# Patient Record
Sex: Male | Born: 1975 | Race: Black or African American | Hispanic: No | Smoking: Current every day smoker
Health system: Southern US, Community
[De-identification: ages and names within clinical notes are randomized; demographics above are authoritative.]

## PROBLEM LIST (undated history)

## (undated) DIAGNOSIS — M549 Dorsalgia, unspecified: Secondary | ICD-10-CM

## (undated) DIAGNOSIS — F419 Anxiety disorder, unspecified: Secondary | ICD-10-CM

---

## 1999-05-14 ENCOUNTER — Emergency Department (HOSPITAL_COMMUNITY): Admission: EM | Admit: 1999-05-14 | Discharge: 1999-05-14 | Payer: Self-pay | Admitting: Emergency Medicine

## 1999-06-17 ENCOUNTER — Emergency Department (HOSPITAL_COMMUNITY): Admission: EM | Admit: 1999-06-17 | Discharge: 1999-06-17 | Payer: Self-pay | Admitting: Emergency Medicine

## 1999-06-17 ENCOUNTER — Encounter: Payer: Self-pay | Admitting: Emergency Medicine

## 2018-01-13 ENCOUNTER — Other Ambulatory Visit: Payer: Self-pay

## 2018-01-13 ENCOUNTER — Emergency Department (HOSPITAL_COMMUNITY)
Admission: EM | Admit: 2018-01-13 | Discharge: 2018-01-13 | Disposition: A | Discharge status: Home / Self Care | Payer: Self-pay | Attending: Emergency Medicine | Admitting: Emergency Medicine

## 2018-01-13 ENCOUNTER — Encounter (HOSPITAL_COMMUNITY): Payer: Self-pay

## 2018-01-13 ENCOUNTER — Emergency Department (HOSPITAL_COMMUNITY): Payer: Self-pay

## 2018-01-13 DIAGNOSIS — R0789 Other chest pain: Secondary | ICD-10-CM | POA: Insufficient documentation

## 2018-01-13 DIAGNOSIS — K649 Unspecified hemorrhoids: Secondary | ICD-10-CM | POA: Insufficient documentation

## 2018-01-13 DIAGNOSIS — R197 Diarrhea, unspecified: Secondary | ICD-10-CM | POA: Insufficient documentation

## 2018-01-13 HISTORY — DX: Dorsalgia, unspecified: M54.9

## 2018-01-13 LAB — CBC
HCT: 39.8 % (ref 39.0–52.0)
Hemoglobin: 13.9 g/dL (ref 13.0–17.0)
MCH: 27.6 pg (ref 26.0–34.0)
MCHC: 34.9 g/dL (ref 30.0–36.0)
MCV: 79 fL (ref 78.0–100.0)
PLATELETS: 203 10*3/uL (ref 150–400)
RBC: 5.04 MIL/uL (ref 4.22–5.81)
RDW: 12.3 % (ref 11.5–15.5)
WBC: 4.4 10*3/uL (ref 4.0–10.5)

## 2018-01-13 LAB — BASIC METABOLIC PANEL
Anion gap: 10 (ref 5–15)
BUN: 7 mg/dL (ref 6–20)
CHLORIDE: 102 mmol/L (ref 98–111)
CO2: 28 mmol/L (ref 22–32)
CREATININE: 1.18 mg/dL (ref 0.61–1.24)
Calcium: 9.5 mg/dL (ref 8.9–10.3)
GFR calc Af Amer: >60 mL/min (ref >60)
GFR calc non Af Amer: >60 mL/min (ref >60)
GLUCOSE: 89 mg/dL (ref 70–99)
POTASSIUM: 3.7 mmol/L (ref 3.5–5.1)
SODIUM: 140 mmol/L (ref 135–145)

## 2018-01-13 LAB — I-STAT TROPONIN, ED: Troponin i, poc: 0.02 ng/mL (ref 0.00–0.08)

## 2018-01-13 MED ORDER — CYCLOBENZAPRINE HCL 10 MG PO TABS: 10.0000 mg | ORAL_TABLET | Freq: Two times a day (BID) | ORAL | 0 refills | Status: DC | PRN

## 2018-01-13 MED ORDER — HYDROCORTISONE 2.5 % RE CREA: TOPICAL_CREAM | RECTAL | 1 refills | Status: DC

## 2018-01-13 NOTE — ED Provider Notes (Addendum)
[facility] EMERGENCY DEPARTMENT Provider Note   CSN: [id number] Arrival date & time: 01/13/18  1910     History   Chief Complaint Chief Complaint  Patient presents with  . Chest Pain  . Diarrhea    HPI Jamie Robinson is a 42 y.o. male.  Patient is a 42 year old male with a history of chronic back pain on tramadol for the last month presenting today with diarrhea, occasional nausea, decreased appetite and some intermittent chest pain.  Patient states that he stopped taking tramadol for 5 days ago and that is when his stool became loose and having diarrhea.  He states because the tramadol constipated and he decided to stop taking it.  He states over the last few days he has noticed some chest pain which he states encompasses the whole left side of his chest and it will have a sharp prickling sensation.  It is not related to exertion and usually starts at rest.  It is not related to eating.  He states he has had issues like this since he was a kid.  No heart history.  He does use marijuana at night to get sleep but denies cocaine use and does not use cigarettes.  He has no significant family history for heart disease.  He states he normally walks several miles a day and has not had any unilateral leg pain or swelling.  He did recently move here at this week from [place] which was about an 8-hour car ride.  He denies any shortness of breath or URI symptoms.  The history is provided by the patient.  Chest Pain   This is a recurrent problem. The current episode started yesterday. The problem occurs daily. The problem has been resolved. Associated with: starts at rest and worse with different movements. The pain is present in the lateral region. The pain is at a severity of 3/10. The pain is mild. The quality of the pain is described as brief and sharp (tingling and stabbing). The pain does not radiate. Exacerbated by: not exertional.  he walks several miles a day and never has  pain with exertion. Associated symptoms include back pain and nausea. Pertinent negatives include no cough, no dizziness, no fever, no numbness, no shortness of breath, no sputum production, no vomiting and no weakness. Associated symptoms comments: Chronic back pain but nothing new.. He has tried nothing for the symptoms. There are no known risk factors.  Diarrhea   Pertinent negatives include no vomiting and no cough.    Past Medical History:  Diagnosis Date  . Back pain     There are no active problems to display for this patient.   History reviewed. No pertinent surgical history.      Home Medications    Prior to Admission medications   Not on File    Family History History reviewed. No pertinent family history.  Social History Social History   Tobacco Use  . Smoking status: Never Smoker  . Smokeless tobacco: Never Used  Substance Use Topics  . Alcohol use: Yes    Comment: occ  . Drug use: Yes    Types: Marijuana     Allergies   Patient has no known allergies.   Review of Systems Review of Systems  Constitutional: Negative for fever.  Respiratory: Negative for cough, sputum production and shortness of breath.   Cardiovascular: Positive for chest pain.  Gastrointestinal: Positive for diarrhea and nausea. Negative for vomiting.  Genitourinary:  History of hemorrhoids and since having the loose stool he has bright red blood when he wipes intermittently.  He has intermittent rectal pain.  Musculoskeletal: Positive for back pain.  Neurological: Negative for dizziness, weakness and numbness.  All other systems reviewed and are negative.    Physical Exam Updated Vital Signs BP 104/66   Pulse 61   Temp 98.1 F (36.7 C) (Oral)   Resp 16   SpO2 100%   Physical Exam  Constitutional: He is oriented to person, place, and time. He appears well-developed and well-nourished. No distress.  HENT:  Head: Normocephalic and atraumatic.  Mouth/Throat:  Oropharynx is clear and moist.  Eyes: Pupils are equal, round, and reactive to light. Conjunctivae and EOM are normal.  Neck: Normal range of motion. Neck supple.  Cardiovascular: Normal rate, regular rhythm and intact distal pulses.  No murmur heard. Pulmonary/Chest: Effort normal and breath sounds normal. No respiratory distress. He has no wheezes. He has no rales.  Abdominal: Soft. He exhibits no distension. There is no tenderness. There is no rebound and no guarding.  Musculoskeletal: Normal range of motion. He exhibits no edema or tenderness.  Neurological: He is alert and oriented to person, place, and time.  Skin: Skin is warm and dry. No rash noted. No erythema.  Psychiatric: He has a normal mood and affect. His behavior is normal.  Nursing note and vitals reviewed.    ED Treatments / Results  Labs (all labs ordered are listed, but only abnormal results are displayed) Labs Reviewed  BASIC METABOLIC PANEL  CBC  I-STAT TROPONIN, ED    EKG EKG Interpretation  Date/Time:  Sunday January 13 2018 19:17:20 EDT Ventricular Rate:  59 PR Interval:  182 QRS Duration: 82 QT Interval:  380 QTC Calculation: 376 R Axis:   65 Text Interpretation:  Sinus bradycardia Otherwise normal ECG No previous tracing Confirmed by Gwyneth Sprout ([id number]) on 01/13/2018 8:14:41 PM   Radiology Dg Chest 2 View  Result Date: 01/13/2018 CLINICAL DATA:  Left side chest pain.  Shortness of breath EXAM: CHEST - 2 VIEW COMPARISON:  None. FINDINGS: Heart and mediastinal contours are within normal limits. No focal opacities or effusions. No acute bony abnormality. IMPRESSION: No active cardiopulmonary disease. Electronically Signed   By: Charlett Nose M.D.   On: 01/13/2018 19:42    Procedures Procedures (including critical care time)  Medications Ordered in ED Medications - No data to display   Initial Impression / Assessment and Plan / ED Course  I have reviewed the triage vital signs and the  nursing notes.  Pertinent labs & imaging results that were available during my care of the patient were reviewed by me and considered in my medical decision making (see chart for details).     Patient presenting today with multiple vague complaints.  Patient's diarrhea is most likely from cessation of opiates that he is been on for the last month.  He does have hemorrhoids which have probably been exacerbated by the diarrhea.  Will treat with Anusol.  He is also complaining of vague chest pain which is not classic for ACS, dissection or abdominal pathology.  He states he has had the symptoms since he was a kid and feel most likely they are musculoskeletal.  Breath sounds are clear without wheezing.  He does not have a history of asthma.  Chest x-ray is clear, EKG shows sinus bradycardia and labs including a CBC, BMP and troponin are all within normal limits.  Patient is [facility]  negative.  Discussed with patient if he plans on not continuing the tramadol he can use Imodium for the diarrhea.  He did request prescription for a muscle relaxer.  He is planning on following up with the [facility] in [place].  Final Clinical Impressions(s) / ED Diagnoses   Final diagnoses:  Diarrhea, unspecified type  Hemorrhoids, unspecified hemorrhoid type  Atypical chest pain    ED Discharge Orders         Ordered    cyclobenzaprine (FLEXERIL) 10 MG tablet  2 times daily PRN     01/13/18 2051    hydrocortisone (ANUSOL-HC) 2.5 % rectal cream     01/13/18 2051           Gwyneth SproutPlunkett, Rosina Cressler, MD 01/13/18 2051    Gwyneth SproutPlunkett, Lisseth Brazeau, MD 01/30/18 1630

## 2018-01-13 NOTE — ED Notes (Signed)
Patient verbalizes understanding of discharge instructions. Opportunity for questioning and answers were provided. Armband removed by staff, pt discharged from ED.  

## 2018-01-13 NOTE — Discharge Instructions (Signed)
You can use Imodium for your diarrhea if you plan on not taking any more tramadol.  You can use the muscle relaxer, Tylenol and ibuprofen as needed for back pain.  The chest pain you are experiencing is most likely related to your neck issues and could also be something in the muscle.  Tylenol and ibuprofen may also help this.

## 2018-08-04 ENCOUNTER — Other Ambulatory Visit: Payer: Self-pay

## 2018-08-04 ENCOUNTER — Emergency Department (HOSPITAL_COMMUNITY): Payer: Self-pay

## 2018-08-04 ENCOUNTER — Encounter (HOSPITAL_COMMUNITY): Payer: Self-pay | Admitting: Emergency Medicine

## 2018-08-04 ENCOUNTER — Emergency Department (HOSPITAL_COMMUNITY)
Admission: EM | Admit: 2018-08-04 | Discharge: 2018-08-04 | Disposition: A | Discharge status: Home / Self Care | Payer: Self-pay | Attending: Emergency Medicine | Admitting: Emergency Medicine

## 2018-08-04 DIAGNOSIS — R0789 Other chest pain: Secondary | ICD-10-CM | POA: Insufficient documentation

## 2018-08-04 DIAGNOSIS — F17210 Nicotine dependence, cigarettes, uncomplicated: Secondary | ICD-10-CM | POA: Insufficient documentation

## 2018-08-04 LAB — BASIC METABOLIC PANEL
ANION GAP: 6 (ref 5–15)
BUN: 8 mg/dL (ref 6–20)
CO2: 27 mmol/L (ref 22–32)
Calcium: 8.6 mg/dL — ABNORMAL LOW (ref 8.9–10.3)
Chloride: 106 mmol/L (ref 98–111)
Creatinine, Ser: 1.19 mg/dL (ref 0.61–1.24)
GFR calc non Af Amer: >60 mL/min (ref >60)
Glucose, Bld: 102 mg/dL — ABNORMAL HIGH (ref 70–99)
Potassium: 3.6 mmol/L (ref 3.5–5.1)
SODIUM: 139 mmol/L (ref 135–145)

## 2018-08-04 LAB — CBC
HCT: 45 % (ref 39.0–52.0)
Hemoglobin: 16.1 g/dL (ref 13.0–17.0)
MCH: 27.7 pg (ref 26.0–34.0)
MCHC: 35.8 g/dL (ref 30.0–36.0)
MCV: 77.5 fL — ABNORMAL LOW (ref 80.0–100.0)
NRBC: 0 % (ref 0.0–0.2)
PLATELETS: 248 10*3/uL (ref 150–400)
RBC: 5.81 MIL/uL (ref 4.22–5.81)
RDW: 12.2 % (ref 11.5–15.5)
WBC: 4.9 10*3/uL (ref 4.0–10.5)

## 2018-08-04 LAB — I-STAT TROPONIN, ED: TROPONIN I, POC: 0 ng/mL (ref 0.00–0.08)

## 2018-08-04 LAB — TROPONIN I

## 2018-08-04 MED ORDER — SODIUM CHLORIDE 0.9% FLUSH: 3.0000 mL | Freq: Once | INTRAVENOUS | Status: DC

## 2018-08-04 NOTE — ED Notes (Signed)
Patient transported to X-ray 

## 2018-08-04 NOTE — ED Provider Notes (Signed)
[facility] EMERGENCY DEPARTMENT Provider Note   CSN: [id number] Arrival date & time: 08/04/18  1616    History   Chief Complaint Chief Complaint  Patient presents with  . Chest Pain    HPI Jamie Robinson is a 43 y.o. male with a history of chronic back pain who presents emergency department complaining of 2 episodes of brief chest pain earlier today.  Patient states that chest pain felt like a lightening bolt sensation radiating across his chest lasting approximately few seconds each episode.  He states he was walking down the stairs during the first episode and walking across his house during the second.  He states both episodes occurred within the past hour and lasted 1 to 2 seconds each.  He denies any recent long trips, unilateral lower extremity swelling, hemoptysis, shortness of breath, fevers, chills, nausea/vomiting/diarrhea, or changes in bladder habits.      Illness  Severity:  Severe Onset quality:  Sudden Duration: seconds. Timing:  Sporadic Progression:  Resolved Chronicity:  New Associated symptoms: chest pain   Associated symptoms: no abdominal pain, no cough, no ear pain, no fever, no rash, no shortness of breath, no sore throat and no vomiting     Past Medical History:  Diagnosis Date  . Back pain     There are no active problems to display for this patient.   History reviewed. No pertinent surgical history.      Home Medications    Prior to Admission medications   Medication Sig Start Date End Date Taking? Authorizing Provider  cyclobenzaprine (FLEXERIL) 10 MG tablet Take 1 tablet (10 mg total) by mouth 2 (two) times daily as needed for muscle spasms. 01/13/18   Gwyneth Sprout, MD  hydrocortisone (ANUSOL-HC) 2.5 % rectal cream Apply rectally 2 times daily 01/13/18   Gwyneth Sprout, MD    Family History No family history on file.  Social History Social History   Tobacco Use  . Smoking status: Current Every Day  Smoker  . Smokeless tobacco: Never Used  Substance Use Topics  . Alcohol use: Yes    Comment: occ  . Drug use: Yes    Types: Marijuana     Allergies   Patient has no known allergies.   Review of Systems Review of Systems  Constitutional: Negative for chills and fever.  HENT: Negative for ear pain and sore throat.   Eyes: Negative for pain and visual disturbance.  Respiratory: Negative for cough and shortness of breath.   Cardiovascular: Positive for chest pain. Negative for palpitations.  Gastrointestinal: Negative for abdominal pain and vomiting.  Genitourinary: Negative for dysuria and hematuria.  Musculoskeletal: Negative for arthralgias and back pain.  Skin: Negative for color change and rash.  Neurological: Negative for seizures and syncope.  All other systems reviewed and are negative.    Physical Exam Updated Vital Signs BP 109/76 (BP Location: Right Arm)   Pulse 90   Temp 98.6 F (37 C) (Oral)   Resp 20   Ht 5' 10.5" (1.791 m)   Wt 82.6 kg   SpO2 99%   BMI 25.75 kg/m   Physical Exam Vitals signs and nursing note reviewed.  Constitutional:      Appearance: He is well-developed.  HENT:     Head: Normocephalic and atraumatic.  Eyes:     Conjunctiva/sclera: Conjunctivae normal.  Neck:     Musculoskeletal: Neck supple.  Cardiovascular:     Rate and Rhythm: Normal rate and regular rhythm.  Heart sounds: No murmur.  Pulmonary:     Effort: Pulmonary effort is normal. No respiratory distress.     Breath sounds: Normal breath sounds.  Abdominal:     Palpations: Abdomen is soft.     Tenderness: There is no abdominal tenderness.  Musculoskeletal:     Right lower leg: No edema.     Left lower leg: No edema.  Skin:    General: Skin is warm and dry.  Neurological:     Mental Status: He is alert.      ED Treatments / Results  Labs (all labs ordered are listed, but only abnormal results are displayed) Labs Reviewed  BASIC METABOLIC PANEL -  Abnormal; Notable for the following components:      Result Value   Glucose, Bld 102 (*)    Calcium 8.6 (*)    All other components within normal limits  CBC - Abnormal; Notable for the following components:   MCV 77.5 (*)    All other components within normal limits  TROPONIN I  I-STAT TROPONIN, ED    EKG EKG Interpretation  Date/Time:  Sunday August 04 2018 16:20:50 EDT Ventricular Rate:  79 PR Interval:  160 QRS Duration: 70 QT Interval:  348 QTC Calculation: 399 R Axis:   68 Text Interpretation:  Normal sinus rhythm Normal ECG No significant change since last tracing Confirmed by Richardean Canal [phone]) on 08/04/2018 5:12:20 PM   Radiology Dg Chest 2 View  Result Date: 08/04/2018 CLINICAL DATA:  Chest pain EXAM: CHEST - 2 VIEW COMPARISON:  January 13, 2018 FINDINGS: There is no edema or consolidation. Heart size and pulmonary vascularity are normal. No adenopathy. No pneumothorax. No bone lesions. IMPRESSION: No edema or consolidation. Electronically Signed   By: Bretta Bang III M.D.   On: 08/04/2018 17:38    Procedures Procedures (including critical care time)  Medications Ordered in ED Medications  sodium chloride flush (NS) 0.9 % injection 3 mL (0 mLs Intravenous Hold 08/04/18 1719)     Initial Impression / Assessment and Plan / ED Course  I have reviewed the triage vital signs and the nursing notes.  Pertinent labs & imaging results that were available during my care of the patient were reviewed by me and considered in my medical decision making (see chart for details).       Patient is a 43 year old male who presents emergency department complaining of 2 episodes of fleeting chest pain within the past hour.  On initial evaluation the patient he was hemodynamically stable and nontoxic-appearing.  Patient was afebrile, not tachycardic, normotensive, satting appropriately on room air.  Physical exam as detailed above which is remarkable for overall healthy  appearing middle-aged male eating in hospital stretcher.  Lungs are clear to auscultation bilaterally with no murmurs rubs or gallops appreciated.  Equal pulses in all 4 extremities with no lower extremity edema.  EKG was obtained which showed normal sinus rhythm with normal intervals and no evidence of ST or T wave abnormalities concerning for acute ischemia.  HEART score of 1. Low risk for ACS. PERC negative leaving PE unlikely.  CBC and CMP unremarkable.  Initial troponin undetectable.  Chest x-ray negative.  Doubt pneumothorax, pneumomediastinum, pulmonary embolism, aortic dissection, or esophageal rupture as cause of the patient symptoms as this would be inconsistent with history, physical exam findings, and work-up.  Delta troponin at 3-hour mark also negative.  Patient symptoms likely secondary to musculoskeletal etiology.  Encouraged him to follow-up with his primary  care physician for ongoing evaluation and care.  I discussed concerning signs and symptoms that would necessitate return to the emergency department.  Patient and wife who is at bedside both voiced understanding of these instructions and had no further questions at this time.   Final Clinical Impressions(s) / ED Diagnoses   Final diagnoses:  Atypical chest pain    ED Discharge Orders    None       Leonette Monarch, MD 08/04/18 2137    Charlynne Pander, MD 08/06/18 [phone]

## 2018-08-04 NOTE — ED Notes (Signed)
Unable to obtain e-sign d/t lack of equipment.  D/c instructions, follow up and return precautions reviewed w/ pt.  Pt and spouse verbalized understanding of all the above.  Armband removed.  Pt d/c'd home.

## 2018-08-04 NOTE — ED Triage Notes (Signed)
C/o intermittent L sided chest pain x 30 min.  Denies SOB, nausea, and vomiting.

## 2019-04-25 ENCOUNTER — Ambulatory Visit: Payer: Self-pay | Admitting: Physical Therapy

## 2019-05-05 ENCOUNTER — Ambulatory Visit: Payer: Non-veteran care | Attending: Neurosurgery | Admitting: Physical Therapy

## 2019-06-06 ENCOUNTER — Ambulatory Visit: Payer: No Typology Code available for payment source | Attending: Neurosurgery | Admitting: Physical Therapy

## 2019-06-06 DIAGNOSIS — R293 Abnormal posture: Secondary | ICD-10-CM | POA: Insufficient documentation

## 2019-06-06 DIAGNOSIS — R252 Cramp and spasm: Secondary | ICD-10-CM | POA: Insufficient documentation

## 2019-06-06 DIAGNOSIS — M542 Cervicalgia: Secondary | ICD-10-CM | POA: Insufficient documentation

## 2019-06-17 ENCOUNTER — Other Ambulatory Visit: Payer: Self-pay

## 2019-06-17 ENCOUNTER — Ambulatory Visit: Payer: No Typology Code available for payment source

## 2019-06-17 DIAGNOSIS — R293 Abnormal posture: Secondary | ICD-10-CM | POA: Diagnosis present

## 2019-06-17 DIAGNOSIS — M542 Cervicalgia: Secondary | ICD-10-CM | POA: Diagnosis present

## 2019-06-17 DIAGNOSIS — R252 Cramp and spasm: Secondary | ICD-10-CM

## 2019-06-17 NOTE — Therapy (Signed)
[facility] [address] [place], [place], [address] Phone: [phone]   Fax:  [phone]  Physical Therapy Evaluation  Patient Details  Name: Jamie Robinson MRN: [id number] Date of Birth: [birth year] Referring Provider (PT): Gloriann Loan   Encounter Date: 06/17/2019  PT End of Session - 06/17/19 1001    Visit Number  1    Number of Visits  15    Date for PT Re-Evaluation  08/01/19    Authorization Type  [facility]    PT Start Time  0955    PT Stop Time  1040    PT Time Calculation (min)  45 min    Activity Tolerance  Patient tolerated treatment well;Patient limited by pain    Behavior During Therapy  [facility] for tasks assessed/performed       Past Medical History:  Diagnosis Date  . Back pain     History reviewed. No pertinent surgical history.  There were no vitals filed for this visit.   Subjective Assessment - 06/17/19 1000    Subjective  He reports cervical pain  and  numb pain in both arms.  Pain started without injury   .  Pain started 2011. Sleep on RT side now.   Reports DDD and herniated disc C 6-7 .    Pertinent History  migraine headaches    Limitations  Standing;Sitting   sleep   Diagnostic tests  xray : 04/2019    Patient Stated Goals  Decrease pain    Currently in Pain?  Yes    Pain Score  1     Pain Location  Neck    Pain Orientation  Left;Posterior    Pain Descriptors / Indicators  Aching;Throbbing;Nagging    Pain Type  Chronic pain    Pain Radiating Towards  lateral upper arm and tingle /numb in whole hand    Pain Onset  More than a month ago    Pain Frequency  Constant    Aggravating Factors   sitting 2-3 hours,   lying watching TV,    Pain Relieving Factors  hot shower ,  stretch   meds         [facility] PT Assessment - 06/17/19 0001      Assessment   Medical Diagnosis  cervicalgia with radicular symptoms    Referring Provider (PT)  Gloriann Loan    Onset Date/Surgical Date  --   20011   Next MD Visit  6 weeks     Prior Therapy  chropractic treatment   PT    has TENS       Precautions   Precautions  None      Restrictions   Weight Bearing Restrictions  No      Balance Screen   Has the patient fallen in the past 6 months  No      Prior Function   Level of Independence  Independent    Vocation  Student    Vocation Requirements  sit alot.  Stand as able       Cognition   Overall Cognitive Status  Within Functional Limits for tasks assessed      Posture/Postural Control   Posture Comments  LT lateral trunk shift and  head tilted to RT       ROM / Strength   AROM / PROM / Strength  AROM;Strength      AROM   AROM Assessment Site  Cervical    Cervical Flexion  45  Cervical Extension  50    Cervical - Right Side Bend  45    Cervical - Left Side Bend  30    Cervical - Right Rotation  55    Cervical - Left Rotation  45      Strength   Overall Strength Comments  WNL      Ambulation/Gait   Gait Comments  Normal                Objective measurements completed on examination: See above findings.      [facility] Adult PT Treatment/Exercise - 06/17/19 0001      Exercises   Exercises  Neck      Neck Exercises: Seated   Neck Retraction  5 reps;5 secs    Cervical Rotation  Right;Left    Cervical Rotation Limitations  2 reps    Lateral Flexion  Right;Left    Lateral Flexion Limitations  2 reeps    Other Seated Exercise  scapula retraction             PT Education - 06/17/19 1001    Education Details  POC    Person(s) Educated  Patient    Methods  Explanation    Comprehension  Verbalized understanding       PT Short Term Goals - 06/17/19 0954      PT SHORT TERM GOAL #1   Title  He will be indpendent with intial HEP    Time  3    Period  Weeks    Status  New      PT SHORT TERM GOAL #2   Title  He will report neck pain decreased 20% or more    Time  3    Period  Weeks    Status  New      PT SHORT TERM GOAL #3   Title  He will  demo understanding of proper sitting and lying posture    Time  3    Period  Weeks        PT Long Term Goals - 06/17/19 0955      PT LONG TERM GOAL #1   Title  He will bre indpendent with all HEP issued    Time  6    Period  Weeks    Status  New      PT LONG TERM GOAL #2   Title  He will report  sleep improved by 50% or better    Time  6    Period  Weeks    Status  New      PT LONG TERM GOAL #3   Title  He will be able to his school work and sit for 60 min without increased pain    Time  6    Period  Weeks    Status  New      PT LONG TERM GOAL #4   Title  He will report decreased 50% or more  headaches    Time  6    Period  Weeks    Status  New             Plan - 06/17/19 1001    Clinical Impression Statement  Mr Elko presentw with chronic neck pain and now with headaches. He has intermittant symptoms with parasthesias into both hands.   He is limtied mostly in sitting. His sleep is disturbed.  He has asymetries with valgus of RT knee and lateral trunk shift Lt and head  forward and tilted RT.   His strength is normal.  He has stiffness of thoracic spine.   he should improve with skilled PT and consistent HEP but with time frame of pain symptoms and no prolonged benefit of previous PT and chiropractics  it is uncleear how far he will progress    Personal Factors and Comorbidities  Time since onset of injury/illness/exacerbation    Examination-Activity Limitations  Sit;Sleep    Stability/Clinical Decision Making  Stable/Uncomplicated    Clinical Decision Making  Low    Rehab Potential  Good    PT Frequency  2x / week    PT Duration  6 weeks    PT Treatment/Interventions  Electrical Stimulation;Moist Heat;Traction;Ultrasound;Therapeutic exercise;Manual techniques;Patient/family education;Taping;Dry needling;Passive range of motion    PT Next Visit Plan  HEP     manual and modalites PRN    PT Home Exercise Plan  cervical and scapula retraction, dervical rotation and  sidebend stretch    Consulted and Agree with Plan of Care  Patient       Patient will benefit from skilled therapeutic intervention in order to improve the following deficits and impairments:  Pain, Decreased activity tolerance, Decreased range of motion, Increased muscle spasms  Visit Diagnosis: Cervicalgia  Abnormal posture  Cramp and spasm     Problem List There are no problems to display for this patient.   Darrel Hoover  PT 06/17/2019, 1:09 PM  [facility] [address] [place], [place], [address] Phone: [phone]   Fax:  [phone]  Name: Jamie Robinson MRN: [id number] Date of Birth: 1975-07-29

## 2019-06-24 ENCOUNTER — Other Ambulatory Visit: Payer: Self-pay

## 2019-06-24 ENCOUNTER — Ambulatory Visit: Payer: No Typology Code available for payment source | Attending: Neurosurgery

## 2019-06-24 DIAGNOSIS — R293 Abnormal posture: Secondary | ICD-10-CM | POA: Insufficient documentation

## 2019-06-24 DIAGNOSIS — R252 Cramp and spasm: Secondary | ICD-10-CM | POA: Insufficient documentation

## 2019-06-24 DIAGNOSIS — M542 Cervicalgia: Secondary | ICD-10-CM | POA: Diagnosis present

## 2019-06-24 NOTE — Patient Instructions (Signed)
Side glide to Lt at hips x 3-5 min side lye daily. Arm openers x 10 Rt /Lt  , green band row extension ER punches x 15 1-2x/day

## 2019-06-24 NOTE — Therapy (Signed)
[place], [place], [address] Phone: [phone]   Fax:  [phone]  Physical Therapy Treatment  Patient Details  Name: ANTOINNE Robinson MRN: [id number] Date of Birth: May 28, 1975 Referring Provider (PT): Concha Pyo   Encounter Date: 06/24/2019  PT End of Session - 06/24/19 0751    Visit Number  2    Number of Visits  15    Date for PT Re-Evaluation  08/01/19    Authorization Type  [place]    PT Start Time  0751    PT Stop Time  0840    PT Time Calculation (min)  49 min    Activity Tolerance  Patient tolerated treatment well;Patient limited by pain    Behavior During Therapy  [facility] for tasks assessed/performed       Past Medical History:  Diagnosis Date  . Back pain     History reviewed. No pertinent surgical history.  There were no vitals filed for this visit.  Subjective Assessment - 06/24/19 0752    Subjective  No pain at moment today. Wants to return to more strenuous activity    Currently in Pain?  No/denies                       [facility] Adult PT Treatment/Exercise - 06/24/19 0001      Neck Exercises: Machines for Strengthening   UBE (Upper Arm Bike)  4 min  L2      Neck Exercises: Theraband   Shoulder Extension  15 reps;Green    Rows  15 reps;Green    Shoulder External Rotation  15 reps;Green    Other Theraband Exercises  forward punches x 15 green band       Neck Exercises: Seated   Neck Retraction  5 reps;5 secs    Cervical Rotation  Right;Left    Cervical Rotation Limitations  2 reps    Lateral Flexion  Right;Left    Lateral Flexion Limitations  2 reeps      Neck Exercises: Sidelying   Other Sidelying Exercise  armopeners x 10 RT and LT     Other Sidelying Exercise  pelvic glide to Lt with pillow under hip x 4 min              PT Education - 06/24/19 0833    Education Details  HEP    Person(s) Educated  Patient    Methods   Explanation;Demonstration;Tactile cues;Verbal cues;Handout    Comprehension  Verbalized understanding;Returned demonstration       PT Short Term Goals - 06/17/19 0954      PT SHORT TERM GOAL #1   Title  He will be indpendent with intial HEP    Time  3    Period  Weeks    Status  New      PT SHORT TERM GOAL #2   Title  He will report neck pain decreased 20% or more    Time  3    Period  Weeks    Status  New      PT SHORT TERM GOAL #3   Title  He will demo understanding of proper sitting and lying posture    Time  3    Period  Weeks        PT Long Term Goals - 06/17/19 0955      PT LONG TERM GOAL #1   Title  He will bre indpendent with all HEP issued  Time  6    Period  Weeks    Status  New      PT LONG TERM GOAL #2   Title  He will report  sleep improved by 50% or better    Time  6    Period  Weeks    Status  New      PT LONG TERM GOAL #3   Title  He will be able to his school work and sit for 60 min without increased pain    Time  6    Period  Weeks    Status  New      PT LONG TERM GOAL #4   Title  He will report decreased 50% or more  headaches    Time  6    Period  Weeks    Status  New            Plan - 06/24/19 0757    Clinical Impression Statement  Tolerated all exercise without increased  or any pain.    PT Treatment/Interventions  Electrical Stimulation;Moist Heat;Traction;Ultrasound;Therapeutic exercise;Manual techniques;Patient/family education;Taping;Dry needling;Passive range of motion    PT Next Visit Plan  HEP  progression   manual and modalites PRN    PT Home Exercise Plan  cervical and scapula retraction, cervical rotation and sidebend stretch , arm openers RT and LT , side glide  pillow under RT hip, green band ER /lfexion /ext/row    Consulted and Agree with Plan of Care  Patient       Patient will benefit from skilled therapeutic intervention in order to improve the following deficits and impairments:  Pain, Decreased activity  tolerance, Decreased range of motion, Increased muscle spasms  Visit Diagnosis: Cervicalgia  Abnormal posture  Cramp and spasm     Problem List There are no problems to display for this patient.   Caprice Red  PT 06/24/2019, 8:35 AM  [facility] [address] [place], [place], [address] Phone: [phone]   Fax:  [phone]  Name: Jamie Robinson MRN: [id number] Date of Birth: 1976-05-08

## 2019-06-26 ENCOUNTER — Ambulatory Visit: Payer: No Typology Code available for payment source

## 2019-07-01 ENCOUNTER — Other Ambulatory Visit: Payer: Self-pay

## 2019-07-01 ENCOUNTER — Ambulatory Visit: Payer: No Typology Code available for payment source

## 2019-07-01 DIAGNOSIS — M542 Cervicalgia: Secondary | ICD-10-CM | POA: Diagnosis not present

## 2019-07-01 DIAGNOSIS — R252 Cramp and spasm: Secondary | ICD-10-CM

## 2019-07-01 DIAGNOSIS — R293 Abnormal posture: Secondary | ICD-10-CM

## 2019-07-01 NOTE — Therapy (Signed)
[facility] [address] [place], [place], [address] Phone: [phone]   Fax:  [phone]  Physical Therapy Treatment  Patient Details  Name: Jamie Robinson MRN: [id number] Date of Birth: 01/09/1976 Referring Provider (PT): Gloriann Loan   Encounter Date: 07/01/2019  PT End of Session - 07/01/19 0740    Visit Number  3    Number of Visits  15    Date for PT Re-Evaluation  08/01/19    Authorization Type  VA    Authorization - Visit Number  3    Authorization - Number of Visits  15    PT Start Time  0740    PT Stop Time  0820    PT Time Calculation (min)  40 min    Activity Tolerance  Patient tolerated treatment well    Behavior During Therapy  [facility] for tasks assessed/performed       Past Medical History:  Diagnosis Date  . Back pain     History reviewed. No pertinent surgical history.  There were no vitals filed for this visit.  Subjective Assessment - 07/01/19 0743    Subjective  No pain . Feel pretty good today    Currently in Pain?  No/denies                       [facility] Adult PT Treatment/Exercise - 07/01/19 0001      Neck Exercises: Machines for Strengthening   UBE (Upper Arm Bike)  6 min  L2    Cybex Row  25# x 20 cued for posture    Cybex Chest Press  25# x 20  cued for posture    Lat Pull  25# x 20  cued for posture      Neck Exercises: Theraband   Shoulder Extension  20 reps;Green    Rows  20 reps;Green    Shoulder External Rotation  20 reps;Green    Other Theraband Exercises  forward punches x 20 green band       Neck Exercises: Standing   Other Standing Exercises  overhead press with 8# bilateral x 20      Neck Exercises: Sidelying   Other Sidelying Exercise  armopeners x 10 RT and LT       Neck Exercises: Prone   Other Prone Exercise  on elbow plank 3x5  5 sec hold  cued to chin tuck       Hand Exercises for Cervical Radiculopathy   Other Hand Exercise for Cervical  Radiculopathy  body blade x 30 sec abduction and ER/IR               PT Short Term Goals - 07/01/19 0744      PT SHORT TERM GOAL #1   Title  He will be indpendent with intial HEP    Status  Achieved      PT SHORT TERM GOAL #2   Title  He will report neck pain decreased 20% or more    Status  Achieved      PT SHORT TERM GOAL #3   Title  He will demo understanding of proper sitting and lying posture    Status  Achieved        PT Long Term Goals - 06/17/19 0955      PT LONG TERM GOAL #1   Title  He will bre indpendent with all HEP issued    Time  6    Period  Weeks    Status  New      PT LONG TERM GOAL #2   Title  He will report  sleep improved by 50% or better    Time  6    Period  Weeks    Status  New      PT LONG TERM GOAL #3   Title  He will be able to his school work and sit for 60 min without increased pain    Time  6    Period  Weeks    Status  New      PT LONG TERM GOAL #4   Title  He will report decreased 50% or more  headaches    Time  6    Period  Weeks    Status  New            Plan - 07/01/19 0740    Clinical Impression Statement  He is doing well with exercises . Will progress with reps of load  as long as no incr pain. He stated he felt good post workout    PT Treatment/Interventions  Electrical Stimulation;Moist Heat;Traction;Ultrasound;Therapeutic exercise;Manual techniques;Patient/family education;Taping;Dry needling;Passive range of motion    PT Next Visit Plan  HEP  progression   manual and modalites PRN    PT Home Exercise Plan  cervical and scapula retraction, cervical rotation and sidebend stretch , arm openers RT and LT , side glide  pillow under RT hip, green band ER /lfexion /ext/row    Consulted and Agree with Plan of Care  Patient       Patient will benefit from skilled therapeutic intervention in order to improve the following deficits and impairments:  Pain, Decreased activity tolerance, Decreased range of motion,  Increased muscle spasms  Visit Diagnosis: Cervicalgia  Abnormal posture  Cramp and spasm     Problem List There are no problems to display for this patient.   Darrel Hoover  PT 07/01/2019, 8:27 AM  [facility] [address] [place], [place], [address] Phone: [phone]   Fax:  [phone]  Name: Jamie Robinson MRN: [id number] Date of Birth: [birth year]

## 2019-07-03 ENCOUNTER — Other Ambulatory Visit: Payer: Self-pay

## 2019-07-03 ENCOUNTER — Ambulatory Visit: Payer: No Typology Code available for payment source

## 2019-07-03 DIAGNOSIS — M542 Cervicalgia: Secondary | ICD-10-CM | POA: Diagnosis not present

## 2019-07-03 DIAGNOSIS — R293 Abnormal posture: Secondary | ICD-10-CM

## 2019-07-03 DIAGNOSIS — R252 Cramp and spasm: Secondary | ICD-10-CM

## 2019-07-03 NOTE — Therapy (Signed)
[place], [place], [address] Phone: [phone]   Fax:  [phone]  Physical Therapy Treatment  Patient Details  Name: Jamie Robinson MRN: [id number] Date of Birth: 1975/10/20 Referring Provider (PT): Concha Pyo   Encounter Date: 07/03/2019  PT End of Session - 07/03/19 0744    Visit Number  4    Number of Visits  15    Date for PT Re-Evaluation  08/01/19    Authorization Type  VA    Authorization - Visit Number  4    Authorization - Number of Visits  15    PT Start Time  4315    PT Stop Time  0823    PT Time Calculation (min)  39 min    Activity Tolerance  Patient tolerated treatment well    Behavior During Therapy  [facility] for tasks assessed/performed       Past Medical History:  Diagnosis Date  . Back pain     No past surgical history on file.  There were no vitals filed for this visit.  Subjective Assessment - 07/03/19 0747    Subjective  No pain . Doing fine    Currently in Pain?  No/denies                       [facility] Adult PT Treatment/Exercise - 07/03/19 0001      Neck Exercises: Machines for Strengthening   UBE (Upper Arm Bike)  6 min  L3  1/2 forward 1/2 back    Cybex Row  25# x 25 cued for posture    Cybex Chest Press  25# x 25 cued for posture    Lat Pull  25# x 25 cued for posture      Neck Exercises: Theraband   Shoulder Extension  Blue;20 reps    Rows  20 reps;Blue    Shoulder External Rotation  20 reps;Blue    Other Theraband Exercises  forward punches x 20 blue  band       Neck Exercises: Standing   Other Standing Exercises  paralell bars  scapula depression and hold body weight  2x5    Other Standing Exercises  overhead press with 8# bilateral x 30      Neck Exercises: Prone   Other Prone Exercise  on elbow plank 3x5  5 sec hold  cued to chin tuck       Hand Exercises for Cervical Radiculopathy   Other Hand Exercise for Cervical Radiculopathy   body blade 4 x 30 sec abduction and ER/IR       Cross body arm pulls with rotation , 30 se x 2 , arm openers x 10 RT /Lt   Traps  Rt/Lt 30 sec          PT Short Term Goals - 07/01/19 0744      PT SHORT TERM GOAL #1   Title  He will be indpendent with intial HEP    Status  Achieved      PT SHORT TERM GOAL #2   Title  He will report neck pain decreased 20% or more    Status  Achieved      PT SHORT TERM GOAL #3   Title  He will demo understanding of proper sitting and lying posture    Status  Achieved        PT Long Term Goals - 06/17/19 0955      PT LONG  TERM GOAL #1   Title  He will bre indpendent with all HEP issued    Time  6    Period  Weeks    Status  New      PT LONG TERM GOAL #2   Title  He will report  sleep improved by 50% or better    Time  6    Period  Weeks    Status  New      PT LONG TERM GOAL #3   Title  He will be able to his school work and sit for 60 min without increased pain    Time  6    Period  Weeks    Status  New      PT LONG TERM GOAL #4   Title  He will report decreased 50% or more  headaches    Time  6    Period  Weeks    Status  New            Plan - 07/03/19 0745    PT Treatment/Interventions  Electrical Stimulation;Moist Heat;Traction;Ultrasound;Therapeutic exercise;Manual techniques;Patient/family education;Taping;Dry needling;Passive range of motion    PT Next Visit Plan  HEP  progression   manual and modalites PRN    PT Home Exercise Plan  cervical and scapula retraction, cervical rotation and sidebend stretch , arm openers RT and LT , side glide  pillow under RT hip, green band ER /lfexion /ext/row    Consulted and Agree with Plan of Care  Patient       Patient will benefit from skilled therapeutic intervention in order to improve the following deficits and impairments:  Pain, Decreased activity tolerance, Decreased range of motion, Increased muscle spasms  Visit Diagnosis: Abnormal posture  Cervicalgia  Cramp  and spasm     Problem List There are no problems to display for this patient.   Caprice Red PT 07/03/2019, 8:19 AM  [facility] [address] [place], [place], [address] Phone: [phone]   Fax:  [phone]  Name: Jamie Robinson MRN: [id number] Date of Birth: 09-Jun-1975

## 2019-07-08 ENCOUNTER — Ambulatory Visit: Payer: No Typology Code available for payment source

## 2019-07-10 ENCOUNTER — Ambulatory Visit: Payer: No Typology Code available for payment source

## 2019-07-15 ENCOUNTER — Ambulatory Visit: Payer: No Typology Code available for payment source

## 2019-07-17 ENCOUNTER — Ambulatory Visit: Payer: No Typology Code available for payment source

## 2019-07-24 ENCOUNTER — Other Ambulatory Visit: Payer: Self-pay

## 2019-07-24 ENCOUNTER — Ambulatory Visit: Payer: No Typology Code available for payment source | Attending: Neurosurgery

## 2019-07-24 DIAGNOSIS — M542 Cervicalgia: Secondary | ICD-10-CM | POA: Diagnosis not present

## 2019-07-24 DIAGNOSIS — R293 Abnormal posture: Secondary | ICD-10-CM | POA: Diagnosis present

## 2019-07-24 NOTE — Therapy (Addendum)
[place], [place], [address] Phone: [phone]   Fax:  [phone]  Physical Therapy Treatment/Discharge  Patient Details  Name: Jamie Robinson MRN: [id number] Date of Birth: 1976-03-25 Referring Provider (PT): Concha Pyo   Encounter Date: 07/24/2019  PT End of Session - 07/24/19 1609    Visit Number  5    Number of Visits  15    Date for PT Re-Evaluation  08/01/19    Authorization Type  VA    Authorization - Visit Number  5    Authorization - Number of Visits  15    PT Start Time  0407    PT Stop Time  0446    PT Time Calculation (min)  39 min    Activity Tolerance  Patient tolerated treatment well    Behavior During Therapy  [facility] for tasks assessed/performed       Past Medical History:  Diagnosis Date  . Back pain     No past surgical history on file.  There were no vitals filed for this visit.  Subjective Assessment - 07/24/19 1613    Subjective  No pain . doing better . Moving more geting up. PT has helped    Currently in Pain?  No/denies                       [facility] Adult PT Treatment/Exercise - 07/24/19 0001      Neck Exercises: Machines for Strengthening   UBE (Upper Arm Bike)  6 min  L3  1/2 forward 1/2 back    Cybex Row  35# x 25 cued for posture    Cybex Chest Press  35# x 25 cued for posture    Lat Pull  35# x 25 cued for posture      Neck Exercises: Theraband   Shoulder Extension  Blue;20 reps    Shoulder External Rotation  20 reps;Blue    Other Theraband Exercises  forward punches x 20 blue  band  and hor abduction      Neck Exercises: Standing   Other Standing Exercises  paralell bars  scapula depression and hold body weight  2x5    Other Standing Exercises  overhead press with 8# bilateral x 30      Neck Exercises: Supine   Other Supine Exercise  stretch leaning back over  green ball       Dead lift 45# x 10.  Prone over bolster x 10  Moving  arms extension to flexion in hor abduction       Prone on elbow plank to pushup position x 10                  Supine over green ball for thoracic extension. X 60 sec        PT Short Term Goals - 07/01/19 0744      PT SHORT TERM GOAL #1   Title  He will be indpendent with intial HEP    Status  Achieved      PT SHORT TERM GOAL #2   Title  He will report neck pain decreased 20% or more    Status  Achieved      PT SHORT TERM GOAL #3   Title  He will demo understanding of proper sitting and lying posture    Status  Achieved        PT Long Term Goals - 07/24/19 1651  PT LONG TERM GOAL #1   Title  He will bre indpendent with all HEP issued    Status  On-going            Plan - 07/24/19 1610    Clinical Impression Statement  Mr Santillo continues to do well with min to no pain. Will continue to push strengthening.    PT Treatment/Interventions  Electrical Stimulation;Moist Heat;Traction;Ultrasound;Therapeutic exercise;Manual techniques;Patient/family education;Taping;Dry needling;Passive range of motion    PT Next Visit Plan  HEP  progression   manual and modalites PRN  blue band for all band exercises,  body blade,  FOTO    LTG assessment    PT Home Exercise Plan  cervical and scapula retraction, cervical rotation and sidebend stretch , arm openers RT and LT , side glide  pillow under RT hip, green band ER /lfexion /ext/row    Consulted and Agree with Plan of Care  Patient       Patient will benefit from skilled therapeutic intervention in order to improve the following deficits and impairments:  Pain, Decreased activity tolerance, Decreased range of motion, Increased muscle spasms  Visit Diagnosis: Cervicalgia  Abnormal posture     Problem List There are no problems to display for this patient.   Darrel Hoover  PT 07/24/2019, 4:55 PM  [facility] [address] [place], [place], [address] Phone:  [phone]   Fax:  [phone]  Name: Jamie Robinson MRN: [id number] Date of Birth: 05-23-1975  PHYSICAL THERAPY DISCHARGE SUMMARY  Visits from Start of Care: 5  Current functional level related to goals / functional outcomes: Unknown. He no showed/canceled several appointments reportley due to transportation issues but he has not returned so will discharge. He was not having pain as of the last 2-3 sessions   Remaining deficits: Unknown  Education / Equipment: HEP  Plan:                                                    Patient goals were not met. Patient is being discharged due to not returning since the last visit.  ?????  Pearson Forster PT 09/04/19

## 2019-08-05 ENCOUNTER — Ambulatory Visit: Payer: No Typology Code available for payment source

## 2019-08-11 ENCOUNTER — Ambulatory Visit: Payer: No Typology Code available for payment source

## 2019-08-14 ENCOUNTER — Ambulatory Visit: Payer: No Typology Code available for payment source

## 2019-08-18 ENCOUNTER — Telehealth: Payer: Self-pay | Admitting: Physical Therapy

## 2019-08-18 ENCOUNTER — Ambulatory Visit: Payer: No Typology Code available for payment source | Admitting: Physical Therapy

## 2019-08-18 NOTE — Telephone Encounter (Signed)
PT attempted to contact the patient in regard to his missed appointment. The number was not valid and unable to leave a message with the patient. The patient's remaining visit on 08/21/2019 will be canceled in compliance with attendance policy due to his recent no show history.

## 2019-08-21 ENCOUNTER — Ambulatory Visit: Payer: No Typology Code available for payment source

## 2020-01-13 ENCOUNTER — Other Ambulatory Visit: Payer: Self-pay

## 2020-01-13 ENCOUNTER — Emergency Department (HOSPITAL_COMMUNITY): Payer: No Typology Code available for payment source

## 2020-01-13 ENCOUNTER — Encounter (HOSPITAL_COMMUNITY): Payer: Self-pay

## 2020-01-13 ENCOUNTER — Emergency Department (HOSPITAL_COMMUNITY)
Admission: EM | Admit: 2020-01-13 | Discharge: 2020-01-13 | Disposition: A | Discharge status: Home / Self Care | Payer: No Typology Code available for payment source | Attending: Emergency Medicine | Admitting: Emergency Medicine

## 2020-01-13 DIAGNOSIS — R6884 Jaw pain: Secondary | ICD-10-CM | POA: Diagnosis not present

## 2020-01-13 DIAGNOSIS — Z5321 Procedure and treatment not carried out due to patient leaving prior to being seen by health care provider: Secondary | ICD-10-CM | POA: Diagnosis not present

## 2020-01-13 DIAGNOSIS — M549 Dorsalgia, unspecified: Secondary | ICD-10-CM | POA: Diagnosis not present

## 2020-01-13 LAB — BASIC METABOLIC PANEL
Anion gap: 9 (ref 5–15)
BUN: 6 mg/dL (ref 6–20)
CO2: 29 mmol/L (ref 22–32)
Calcium: 9.7 mg/dL (ref 8.9–10.3)
Chloride: 103 mmol/L (ref 98–111)
Creatinine, Ser: 1.07 mg/dL (ref 0.61–1.24)
GFR calc Af Amer: >60 mL/min (ref >60)
GFR calc non Af Amer: >60 mL/min (ref >60)
Glucose, Bld: 92 mg/dL (ref 70–99)
Potassium: 3.8 mmol/L (ref 3.5–5.1)
Sodium: 141 mmol/L (ref 135–145)

## 2020-01-13 LAB — CBC
HCT: 44.8 % (ref 39.0–52.0)
Hemoglobin: 16.1 g/dL (ref 13.0–17.0)
MCH: 29 pg (ref 26.0–34.0)
MCHC: 35.9 g/dL (ref 30.0–36.0)
MCV: 80.6 fL (ref 80.0–100.0)
Platelets: 193 10*3/uL (ref 150–400)
RBC: 5.56 MIL/uL (ref 4.22–5.81)
RDW: 12 % (ref 11.5–15.5)
WBC: 3.7 10*3/uL — ABNORMAL LOW (ref 4.0–10.5)
nRBC: 0 % (ref 0.0–0.2)

## 2020-01-13 LAB — TROPONIN I (HIGH SENSITIVITY): Troponin I (High Sensitivity): <2 ng/L (ref <18)

## 2020-01-13 NOTE — ED Notes (Signed)
Patient decided to leave.   

## 2020-01-13 NOTE — ED Triage Notes (Signed)
Patient complains of right sided jaw pain with back pain since early am. Denies Cp. Patient alert and oriented, NAD.

## 2020-01-15 ENCOUNTER — Ambulatory Visit (HOSPITAL_COMMUNITY)
Admission: EM | Admit: 2020-01-15 | Discharge: 2020-01-15 | Disposition: A | Discharge status: Home / Self Care | Payer: No Typology Code available for payment source | Attending: Family Medicine | Admitting: Family Medicine

## 2020-01-15 ENCOUNTER — Other Ambulatory Visit: Payer: Self-pay

## 2020-01-15 ENCOUNTER — Encounter (HOSPITAL_COMMUNITY): Payer: Self-pay | Admitting: Emergency Medicine

## 2020-01-15 DIAGNOSIS — K644 Residual hemorrhoidal skin tags: Secondary | ICD-10-CM | POA: Diagnosis not present

## 2020-01-15 MED ORDER — POLYETHYLENE GLYCOL 3350 17 GM/SCOOP PO POWD: 1.0000 | Freq: Once | ORAL | 0 refills | Status: AC

## 2020-01-15 MED ORDER — HYDROCORTISONE (PERIANAL) 2.5 % EX CREA: 1.0000 "application " | TOPICAL_CREAM | Freq: Three times a day (TID) | CUTANEOUS | 0 refills | Status: DC

## 2020-01-15 MED ORDER — ALUM SULFATE-CA ACETATE EX PACK: PACK | CUTANEOUS | 0 refills | Status: DC

## 2020-01-15 NOTE — ED Notes (Signed)
Pt did not answer.

## 2020-01-15 NOTE — ED Triage Notes (Signed)
Pt c/o bleeding hemorrhoids and painful with pain shooting down the left leg. Pt states this is a chronic problem and he would like to get them removed.

## 2020-01-15 NOTE — ED Provider Notes (Signed)
[facility]    CSN: [id number] Arrival date & time: 01/15/20  1850      History   Chief Complaint Chief Complaint  Patient presents with  . Hemorrhoids    HPI Jamie Robinson is a 44 y.o. male.   HPI  Patient is usually cared for at the [facility] hospital.  He is unable to get to the [facility] hospital.  He states that they give him a powder to put in his bathtub for sits baths.  He states that this helps with his hemorrhoids.  He is uncertain of the name of this powder.  The only powder packets I can think of her Domeboro.  He says he thinks that might be added.  He has had hemorrhoids off and on for some years.  They are bothering him right now.  No blood in bowels.  No constipation or diarrhea he has never had a colonoscopy.  I told him that at 39 he is due  Past Medical History:  Diagnosis Date  . Back pain     There are no problems to display for this patient.   History reviewed. No pertinent surgical history.     Home Medications    Prior to Admission medications   Medication Sig Start Date End Date Taking? Authorizing Provider  aluminum sulfate-calcium acetate (DOMEBORO) packet Placed packet contents in a few inches of warm water in the tub for sit bath 01/15/20   Eustace Moore, MD  cyclobenzaprine (FLEXERIL) 10 MG tablet Take 1 tablet (10 mg total) by mouth 2 (two) times daily as needed for muscle spasms. 01/13/18   Gwyneth Sprout, MD  hydrocortisone (ANUSOL-HC) 2.5 % rectal cream Place 1 application rectally 3 (three) times daily. 01/15/20   Eustace Moore, MD  polyethylene glycol powder [facility]) 17 GM/SCOOP powder Take 255 g by mouth once for 1 dose. 01/15/20 01/15/20  Eustace Moore, MD    Family History History reviewed. No pertinent family history.  Social History Social History   Tobacco Use  . Smoking status: Current Every Day Smoker  . Smokeless tobacco: Never Used  Substance Use Topics  . Alcohol use: Yes    Comment: occ  . Drug use:  Yes    Types: Marijuana     Allergies   Patient has no known allergies.   Review of Systems Review of Systems  See HPI Physical Exam Triage Vital Signs ED Triage Vitals  Enc Vitals Group     BP 01/15/20 2013 114/73     Pulse Rate 01/15/20 2013 78     Resp 01/15/20 2013 16     Temp 01/15/20 2013 98.7 F (37.1 C)     Temp Source 01/15/20 2013 Oral     SpO2 01/15/20 2013 100 %     Weight --      Height --      Head Circumference --      Peak Flow --      Pain Score 01/15/20 2009 8     Pain Loc --      Pain Edu? --      Excl. in GC? --    No data found.  Updated Vital Signs BP 114/73 (BP Location: Left Arm)   Pulse 78   Temp 98.7 F (37.1 C) (Oral)   Resp 16   SpO2 100%      Physical Exam Constitutional:      General: He is not in acute distress.    Appearance:  He is well-developed.     Comments: Appears uncomfortable  HENT:     Head: Normocephalic and atraumatic.     Mouth/Throat:     Comments: Mask is in place Eyes:     Conjunctiva/sclera: Conjunctivae normal.     Pupils: Pupils are equal, round, and reactive to light.  Cardiovascular:     Rate and Rhythm: Normal rate.  Pulmonary:     Effort: Pulmonary effort is normal. No respiratory distress.  Abdominal:     General: There is no distension.     Palpations: Abdomen is soft.  Genitourinary:    Comments: Cluster of external hemorrhoids seen at the 6:00 area.  No bleeding or fissure.  Not thrombosed Musculoskeletal:        General: Normal range of motion.     Cervical back: Normal range of motion.  Skin:    General: Skin is warm and dry.  Neurological:     Mental Status: He is alert.  Psychiatric:        Behavior: Behavior normal.      UC Treatments / Results  Labs (all labs ordered are listed, but only abnormal results are displayed) Labs Reviewed - No data to display  EKG   Radiology No results found.  Procedures Procedures (including critical care time)  Medications Ordered in  UC Medications - No data to display  Initial Impression / Assessment and Plan / UC Course  I have reviewed the triage vital signs and the nursing notes.  Pertinent labs & imaging results that were available during my care of the patient were reviewed by me and considered in my medical decision making (see chart for details).     Reviewed management of hemorrhoids.  Stool softeners.  Wet wipes.  Rectal creams.  I gave them packets of Domeboro to try.  [facility]  He needs referral to a surgeon for definitive care.  Due for colonoscopy. Final Clinical Impressions(s) / UC Diagnoses   Final diagnoses:  External hemorrhoids     Discharge Instructions     Use moist wipes instead of toilet tissue Take MiraLAX every day to keep stools soft  Use Anusol cream 2-3 times a day when hemorrhoids are irritated May also use sitz bath's to reduce irritation Follow-up with surgeon    ED Prescriptions    Medication Sig Dispense Auth. Provider   hydrocortisone (ANUSOL-HC) 2.5 % rectal cream Place 1 application rectally 3 (three) times daily. 30 g Eustace Moore, MD   polyethylene glycol powder [facility]) 17 GM/SCOOP powder Take 255 g by mouth once for 1 dose. 255 g Eustace Moore, MD   aluminum sulfate-calcium acetate [facility] packet Placed packet contents in a few inches of warm water in the tub for sit bath 100 each Delton See Letta Pate, MD     PDMP not reviewed this encounter.   Eustace Moore, MD 01/15/20 2132

## 2020-01-15 NOTE — Discharge Instructions (Signed)
Use moist wipes instead of toilet tissue Take MiraLAX every day to keep stools soft  Use Anusol cream 2-3 times a day when hemorrhoids are irritated May also use sitz bath's to reduce irritation Follow-up with surgeon

## 2020-01-16 ENCOUNTER — Telehealth (HOSPITAL_COMMUNITY): Payer: Self-pay | Admitting: Emergency Medicine

## 2020-01-16 MED ORDER — HYDROCORTISONE (PERIANAL) 2.5 % EX CREA
1.0000 | TOPICAL_CREAM | Freq: Three times a day (TID) | CUTANEOUS | 0 refills | Status: DC
Start: 2020-01-16 — End: 2022-10-26

## 2020-01-16 MED ORDER — ALUM SULFATE-CA ACETATE EX PACK
PACK | CUTANEOUS | 0 refills | Status: DC
Start: 2020-01-16 — End: 2022-10-26

## 2020-01-16 MED ORDER — HYDROCORTISONE (PERIANAL) 2.5 % EX CREA
1.0000 | TOPICAL_CREAM | Freq: Three times a day (TID) | CUTANEOUS | 0 refills | Status: DC
Start: 2020-01-16 — End: 2020-01-16

## 2020-01-16 MED ORDER — ALUM SULFATE-CA ACETATE EX PACK
PACK | CUTANEOUS | 0 refills | Status: DC
Start: 2020-01-16 — End: 2020-01-16

## 2020-01-16 NOTE — Telephone Encounter (Signed)
Patient called stating he could not afford the meds at [facility], but was told [facility] might cover them.  Resent to pharmacy of request

## 2020-01-16 NOTE — Telephone Encounter (Signed)
Patient called and states he is still unable to get coverage for his meds at [facility]  Asked me to try the [place] which he is seen at.  Sent again.

## 2020-02-14 ENCOUNTER — Encounter (HOSPITAL_COMMUNITY): Payer: Self-pay

## 2020-02-14 ENCOUNTER — Ambulatory Visit (HOSPITAL_COMMUNITY)
Admission: EM | Admit: 2020-02-14 | Discharge: 2020-02-14 | Disposition: A | Discharge status: Home / Self Care | Payer: No Typology Code available for payment source | Attending: Urgent Care | Admitting: Urgent Care

## 2020-02-14 ENCOUNTER — Other Ambulatory Visit: Payer: Self-pay

## 2020-02-14 DIAGNOSIS — Z711 Person with feared health complaint in whom no diagnosis is made: Secondary | ICD-10-CM

## 2020-02-14 NOTE — Discharge Instructions (Addendum)
Keep track of symptoms and follow up with PCP.

## 2020-02-14 NOTE — ED Triage Notes (Signed)
Pt c/o 3/10 pinching pain in right chest that radiates to left shoulder that started at 1030 today. Pt states he also has dizziness. Pt denies N/V, SOB. Pt has non labored breathing.

## 2020-02-14 NOTE — ED Provider Notes (Signed)
[facility]    CSN: [id number] Arrival date & time: 02/14/20  1151      History   Chief Complaint Chief Complaint  Patient presents with  . Chest Pain    HPI Jamie Robinson is a 44 y.o. male presenting for evaluation s/p episode of bilateral chest pain, dizziness.  States this occurred around 1030 this morning after smoking marijuana.  Patient states that he regularly smokes, though has decreased over the last week.  States that he tends to feel this way when smoking.  Denies change in vision or hearing, tinnitus, nausea or vomiting, weakness, numbness.  Describes right-sided chest pain as a pinching sensation, left is achy, more near his shoulder.  Denies palpitations.  States he is currently asymptomatic, though wanted to "check and be sure ".   Past Medical History:  Diagnosis Date  . Back pain     There are no problems to display for this patient.   History reviewed. No pertinent surgical history.     Home Medications    Prior to Admission medications   Medication Sig Start Date End Date Taking? Authorizing Provider  aluminum sulfate-calcium acetate (DOMEBORO) packet Placed packet contents in a few inches of warm water in the tub for sit bath 01/16/20   Lamptey, Britta Mccreedy, MD  hydrocortisone (ANUSOL-HC) 2.5 % rectal cream Place 1 application rectally 3 (three) times daily. 01/16/20   Lamptey, Britta Mccreedy, MD    Family History History reviewed. No pertinent family history.  Social History Social History   Tobacco Use  . Smoking status: Current Every Day Smoker    Types: Cigarettes  . Smokeless tobacco: Never Used  Substance Use Topics  . Alcohol use: Yes    Comment: occ  . Drug use: Yes    Types: Marijuana     Allergies   Patient has no known allergies.   Review of Systems As per HPI   Physical Exam Triage Vital Signs ED Triage Vitals  Enc Vitals Group     BP 02/14/20 1211 122/80     Pulse Rate 02/14/20 1211 61     Resp 02/14/20 1211  18     Temp 02/14/20 1211 97.8 F (36.6 C)     Temp Source 02/14/20 1211 Oral     SpO2 02/14/20 1211 100 %     Weight 02/14/20 1214 169 lb (76.7 kg)     Height 02/14/20 1214 5\' 10"  (1.778 m)     Head Circumference --      Peak Flow --      Pain Score 02/14/20 1213 3     Pain Loc --      Pain Edu? --      Excl. in GC? --    No data found.  Updated Vital Signs BP 122/80   Pulse 61   Temp 97.8 F (36.6 C) (Oral)   Resp 18   Ht 5\' 10"  (1.778 m)   Wt 169 lb (76.7 kg)   SpO2 100%   BMI 24.25 kg/m   Visual Acuity Right Eye Distance:   Left Eye Distance:   Bilateral Distance:    Right Eye Near:   Left Eye Near:    Bilateral Near:     Physical Exam Constitutional:      General: He is not in acute distress. HENT:     Head: Normocephalic and atraumatic.     Right Ear: Tympanic membrane, ear canal and external ear normal.     Left  Ear: Tympanic membrane, ear canal and external ear normal.     Mouth/Throat:     Mouth: Mucous membranes are moist.     Pharynx: Oropharynx is clear.  Eyes:     General: No scleral icterus.    Extraocular Movements: Extraocular movements intact.     Conjunctiva/sclera: Conjunctivae normal.     Pupils: Pupils are equal, round, and reactive to light.  Cardiovascular:     Rate and Rhythm: Normal rate.  Pulmonary:     Effort: Pulmonary effort is normal. No respiratory distress.     Breath sounds: No wheezing.  Musculoskeletal:        General: No deformity. Normal range of motion.     Cervical back: Normal range of motion. No rigidity or tenderness.  Lymphadenopathy:     Cervical: No cervical adenopathy.  Skin:    Capillary Refill: Capillary refill takes less than 2 seconds.     Coloration: Skin is not jaundiced.     Findings: No bruising or rash.  Neurological:     Mental Status: He is alert.     Cranial Nerves: Cranial nerves are intact.     Sensory: Sensation is intact.     Motor: Motor function is intact.     Coordination:  Coordination is intact.     Gait: Gait is intact.  Psychiatric:        Mood and Affect: Mood normal.        Behavior: Behavior normal.      UC Treatments / Results  Labs (all labs ordered are listed, but only abnormal results are displayed) Labs Reviewed - No data to display  EKG   Radiology No results found.  Procedures Procedures (including critical care time)  Medications Ordered in UC Medications - No data to display  Initial Impression / Assessment and Plan / UC Course  I have reviewed the triage vital signs and the nursing notes.  Pertinent labs & imaging results that were available during my care of the patient were reviewed by me and considered in my medical decision making (see chart for details).     Patient afebrile, nontoxic, and hemodynamically stable.  Currently asymptomatic in office.  EKG done in office, reviewed by me compared to previous from August 2021: Sinus bradycardia with ventricular at 53 bpm. No QTC prolongation, ST elevation or depression. Waveforms stable in all leads-nonacute.  Discussed importance of marijuana cessation as this seems to be common trigger.  No further interventions required.  Return precautions discussed, pt verbalized understanding and is agreeable to plan. Final Clinical Impressions(s) / UC Diagnoses   Final diagnoses:  Worried well     Discharge Instructions     Keep track of symptoms and follow up with PCP.    ED Prescriptions    None     PDMP not reviewed this encounter.   Hall-Potvin, [place], [place] 02/14/20 1342

## 2020-03-09 ENCOUNTER — Emergency Department (HOSPITAL_COMMUNITY)
Admission: EM | Admit: 2020-03-09 | Discharge: 2020-03-10 | Disposition: A | Discharge status: Home / Self Care | Payer: No Typology Code available for payment source | Attending: Emergency Medicine | Admitting: Emergency Medicine

## 2020-03-09 ENCOUNTER — Encounter (HOSPITAL_COMMUNITY): Payer: Self-pay | Admitting: Emergency Medicine

## 2020-03-09 ENCOUNTER — Other Ambulatory Visit: Payer: Self-pay

## 2020-03-09 DIAGNOSIS — K649 Unspecified hemorrhoids: Secondary | ICD-10-CM | POA: Insufficient documentation

## 2020-03-09 DIAGNOSIS — R58 Hemorrhage, not elsewhere classified: Secondary | ICD-10-CM | POA: Diagnosis present

## 2020-03-09 DIAGNOSIS — Z5321 Procedure and treatment not carried out due to patient leaving prior to being seen by health care provider: Secondary | ICD-10-CM | POA: Insufficient documentation

## 2020-03-09 NOTE — ED Triage Notes (Signed)
Patient arrives to ED with complaints of painful bleeding external hemorrhoids x7 months. Pt has used creams and sitz bath w/o relief. 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 Would like surgery consult.

## 2020-03-09 NOTE — ED Notes (Signed)
Patient states he wants to leave and treat himself at home.

## 2020-08-19 ENCOUNTER — Encounter (HOSPITAL_COMMUNITY): Payer: Self-pay | Admitting: Emergency Medicine

## 2020-08-19 ENCOUNTER — Emergency Department (HOSPITAL_COMMUNITY)
Admission: EM | Admit: 2020-08-19 | Discharge: 2020-08-19 | Disposition: A | Discharge status: Home / Self Care | Payer: No Typology Code available for payment source | Attending: Emergency Medicine | Admitting: Emergency Medicine

## 2020-08-19 ENCOUNTER — Other Ambulatory Visit: Payer: Self-pay

## 2020-08-19 ENCOUNTER — Emergency Department (HOSPITAL_COMMUNITY): Payer: No Typology Code available for payment source

## 2020-08-19 DIAGNOSIS — R0789 Other chest pain: Secondary | ICD-10-CM

## 2020-08-19 DIAGNOSIS — R001 Bradycardia, unspecified: Secondary | ICD-10-CM | POA: Insufficient documentation

## 2020-08-19 DIAGNOSIS — R072 Precordial pain: Secondary | ICD-10-CM | POA: Insufficient documentation

## 2020-08-19 DIAGNOSIS — R079 Chest pain, unspecified: Secondary | ICD-10-CM

## 2020-08-19 DIAGNOSIS — F17210 Nicotine dependence, cigarettes, uncomplicated: Secondary | ICD-10-CM | POA: Diagnosis not present

## 2020-08-19 LAB — BASIC METABOLIC PANEL
Anion gap: 7 (ref 5–15)
BUN: <5 mg/dL — ABNORMAL LOW (ref 6–20)
CO2: 29 mmol/L (ref 22–32)
Calcium: 9.6 mg/dL (ref 8.9–10.3)
Chloride: 101 mmol/L (ref 98–111)
Creatinine, Ser: 1.05 mg/dL (ref 0.61–1.24)
GFR, Estimated: >60 mL/min (ref >60)
Glucose, Bld: 98 mg/dL (ref 70–99)
Potassium: 3.8 mmol/L (ref 3.5–5.1)
Sodium: 137 mmol/L (ref 135–145)

## 2020-08-19 LAB — CBC
HCT: 41.9 % (ref 39.0–52.0)
Hemoglobin: 15.1 g/dL (ref 13.0–17.0)
MCH: 28.8 pg (ref 26.0–34.0)
MCHC: 36 g/dL (ref 30.0–36.0)
MCV: 80 fL (ref 80.0–100.0)
Platelets: 213 10*3/uL (ref 150–400)
RBC: 5.24 MIL/uL (ref 4.22–5.81)
RDW: 12.7 % (ref 11.5–15.5)
WBC: 4.5 10*3/uL (ref 4.0–10.5)
nRBC: 0 % (ref 0.0–0.2)

## 2020-08-19 LAB — TROPONIN I (HIGH SENSITIVITY)
Troponin I (High Sensitivity): <2 ng/L (ref <18)
Troponin I (High Sensitivity): 3 ng/L (ref <18)

## 2020-08-19 NOTE — ED Provider Notes (Signed)
[name]   CSN: [id number] Arrival date & time: 08/19/20  1944     History Chief Complaint  Patient presents with  . Chest Pain    Jamie Robinson is a 45 y.o. male.  Patient is a 45 year old male with no medical conditions who presents with chest pain.  Patient reports that his chest pain started around 11 AM today.  He states that he was picking up a door dash order when he went to sit down in a chair.  When he was sitting down he noticed a sudden onset pinpoint substernal chest pain that lasted about 3 seconds. He described it as crushing. Patient states that it went away immediately.  He has not experienced the pain since then.  He denies any shortness of breath associated with the pain.  He denies any palpitations associated with the pain.  He has never had this pain before.  He was worried when he had the pain about having a heart attack.  Patient denies any leg pain or leg swelling.  Patient has no medical conditions.  He has never seen a cardiologist.     HPI: A 45 year old patient presents for evaluation of chest pain. Initial onset of pain was more than 6 hours ago. The patient's chest pain is well-localized, is described as heaviness/pressure/tightness and is not worse with exertion. The patient's chest pain is middle- or left-sided, is not sharp and does not radiate to the arms/jaw/neck. The patient does not complain of nausea and denies diaphoresis. The patient has no history of stroke, has no history of peripheral artery disease, has not smoked in the past 90 days, denies any history of treated diabetes, has no relevant family history of coronary artery disease (first degree relative at less than age 69), is not hypertensive, has no history of hypercholesterolemia and does not have an elevated BMI (>=30).   Past Medical History:  Diagnosis Date  . Back pain     There are no problems to display for this  patient.   History reviewed. No pertinent surgical history.     No family history on file.  Social History   Tobacco Use  . Smoking status: Current Every Day Smoker    Types: Cigarettes  . Smokeless tobacco: Never Used  Substance Use Topics  . Alcohol use: Yes    Comment: occ  . Drug use: Yes    Types: Marijuana    Home Medications Prior to Admission medications   Medication Sig Start Date End Date Taking? Authorizing Provider  Cholecalciferol 50 MCG (2000 UT) TABS Take 2,000 Units by mouth daily. 05/17/20  Yes [provider]  cyclobenzaprine (FLEXERIL) 10 MG tablet Take 10 mg by mouth 3 (three) times daily as needed for muscle spasms. 05/10/20  Yes [provider]  prazosin (MINIPRESS) 1 MG capsule Take 1 mg by mouth at bedtime. 05/19/20  Yes [provider]  psyllium (METAMUCIL) 58.6 % packet Take 1 packet by mouth daily as needed (constipation). 02/13/20  Yes [provider]  QUEtiapine (SEROQUEL) 25 MG tablet Take 25 mg by mouth at bedtime. 05/19/20  Yes [provider]  sertraline (ZOLOFT) 50 MG tablet Take 25 mg by mouth daily. 05/19/20  Yes [provider]  aluminum sulfate-calcium acetate (DOMEBORO) packet Placed packet contents in a few inches of warm water in the tub for sit bath Patient not taking: Reported on 08/19/2020 01/16/20   Merrilee Jansky, MD  hydrocortisone (ANUSOL-HC)  2.5 % rectal cream Place 1 application rectally 3 (three) times daily. Patient not taking: Reported on 08/19/2020 01/16/20   Merrilee Jansky, MD    Allergies    Patient has no known allergies.  Review of Systems   Review of Systems  Constitutional: Negative for chills and fever.  HENT: Negative for ear pain and sore throat.   Eyes: Negative for pain and visual disturbance.  Respiratory: Negative for cough and shortness of breath.   Cardiovascular: Positive for chest pain. Negative for palpitations.  Gastrointestinal: Negative for  abdominal pain and vomiting.  Genitourinary: Negative for dysuria and hematuria.  Musculoskeletal: Negative for arthralgias and back pain.  Skin: Negative for color change and rash.  Neurological: Negative for seizures and syncope.  All other systems reviewed and are negative.   Physical Exam Updated Vital Signs BP 126/89   Pulse (!) 47   Temp 98.4 F (36.9 C) (Oral)   Resp 18   Ht 5\' 10"  (1.778 m)   Wt 83.5 kg   SpO2 99%   BMI 26.40 kg/m   Physical Exam Vitals and nursing note reviewed.  Constitutional:      Appearance: He is well-developed.  HENT:     Head: Normocephalic and atraumatic.  Eyes:     Conjunctiva/sclera: Conjunctivae normal.  Cardiovascular:     Rate and Rhythm: Regular rhythm. Bradycardia present.     Pulses:          Radial pulses are 2+ on the right side and 2+ on the left side.  Pulmonary:     Effort: Pulmonary effort is normal. No respiratory distress.     Breath sounds: Normal breath sounds. No wheezing or rhonchi.  Abdominal:     Palpations: Abdomen is soft.     Tenderness: There is no abdominal tenderness.  Musculoskeletal:     Cervical back: Neck supple.     Right lower leg: No edema.     Left lower leg: No edema.  Skin:    General: Skin is warm and dry.  Neurological:     Mental Status: He is alert.     ED Results / Procedures / Treatments   Labs (all labs ordered are listed, but only abnormal results are displayed) Labs Reviewed  BASIC METABOLIC PANEL - Abnormal; Notable for the following components:      Result Value   BUN <5 (*)    All other components within normal limits  CBC  TROPONIN I (HIGH SENSITIVITY)  TROPONIN I (HIGH SENSITIVITY)    EKG EKG Interpretation  Date/Time:  Thursday August 19 2020 21:21:31 EDT Ventricular Rate:  62 PR Interval:  188 QRS Duration: 92 QT Interval:  378 QTC Calculation: 384 R Axis:   70 Text Interpretation: Sinus rhythm Low voltage, precordial leads When compared to prior, similar  appearance. No STEMI Confirmed by [id number] (Theda Belfast) on 08/19/2020 10:36:25 PM   Radiology DG Chest 2 View  Result Date: 08/19/2020 CLINICAL DATA:  Chest pain today. EXAM: CHEST - 2 VIEW COMPARISON:  01/12/2030 FINDINGS: The cardiomediastinal contours are normal. The lungs are clear. Pulmonary vasculature is normal. No consolidation, pleural effusion, or pneumothorax. No acute osseous abnormalities are seen. IMPRESSION: Negative radiographs of the chest. Electronically Signed   By: 01/14/2030 M.D.   On: 08/19/2020 20:12    Procedures Procedures   Medications Ordered in ED Medications - No data to display  ED Course  I have reviewed the triage vital signs and the nursing notes.  Pertinent  labs & imaging results that were available during my care of the patient were reviewed by me and considered in my medical decision making (see chart for details).    MDM Rules/Calculators/A&P HEAR Score: 1                        Patient is a 45 year old male who presents with concerns for chest pain that occurred about 12 hours ago.  Patient was sitting down when he experienced a sudden onset chest pressure.  The pressure went away after 3 seconds of rest.  He is currently well-appearing and hemodynamically stable.  Heart rate slow, however on EKG no ischemic changes in sinus rhythm.  Low risk HEART score of 1.  Chest x-ray reveals no pneumothorax or pneumonia.  Patient symptoms have since resolved.  I have very low suspicion for pulmonary embolism based off of history and presentation and patient is re-confirmed low risk based off of PERC algorithm.   Initial troponin less than 2, and subsequent troponin 3.  Less likely ACS at this time.  I have a low suspicion for myocardial infarction, pulmonary embolism, aortic dissection, pneumonia, or pneumothorax based off patient's presentation today.  Been hemodynamically stable throughout his stay in the emergency department.  On frequent  reevaluation patient is doing well in no acute distress.  He has not had recurrence of the symptoms.  Of all this information I feel that he is stable for discharge home at this time he will follow up with his primary care provider as necessary.  He will return to emergency department if he feels he has return of his chest pain or he requires further emergent care.  Final Clinical Impression(s) / ED Diagnoses Final diagnoses:  Chest wall pain  Chest pain, unspecified type    Rx / DC Orders ED Discharge Orders    None       Montoya Watkin, [place], MD 08/19/20 2329    Tegeler, Canary Brim, MD 08/21/20 [phone]

## 2020-08-19 NOTE — ED Triage Notes (Signed)
Pt reports central chest pain that started while working today. Pt reports pain has subsided but he feels like he has been having palpitations since. C/o intermittent shortness of breath x 2 months.

## 2020-09-16 ENCOUNTER — Encounter (HOSPITAL_COMMUNITY): Payer: Self-pay | Admitting: Emergency Medicine

## 2020-09-16 ENCOUNTER — Other Ambulatory Visit: Payer: Self-pay

## 2020-09-16 ENCOUNTER — Ambulatory Visit (HOSPITAL_COMMUNITY)
Admission: EM | Admit: 2020-09-16 | Discharge: 2020-09-16 | Disposition: A | Discharge status: Home / Self Care | Payer: No Typology Code available for payment source | Attending: Family Medicine | Admitting: Family Medicine

## 2020-09-16 DIAGNOSIS — R079 Chest pain, unspecified: Secondary | ICD-10-CM

## 2020-09-16 NOTE — ED Provider Notes (Signed)
[facility]   [id number] 09/16/20 Arrival Time: 1508  ASSESSMENT & PLAN:  1. Chest pain, unspecified type    Single episode approx one hour ago that has resolved. Without associated SOB/n/v/d.  ECG: Performed today and interpreted by me: normal EKG, normal sinus rhythm. No STEMI.  Discussed ED evaluation for troponins. He prefers home observation and agrees to proceed to ED should any symptoms recur.    Discharge Instructions     You have been seen at the [facility] today for chest pain. Your evaluation today was not suggestive of any emergent condition requiring medical intervention at this time. Your ECG (heart tracing) did not show any worrisome changes. However, some medical problems make take more time to appear. Therefore, it's very important that you pay attention to any new symptoms or worsening of your current condition.  Please proceed directly to the Emergency Department immediately should you feel worse in any way or have any of the following symptoms: any recurrence of chest pain, back or abdomen pain, shortness of breath, or nausea and vomiting.      Follow-up Information    [facility] EMERGENCY DEPARTMENT.   Specialty: Emergency Medicine Why: If symptoms return and/or worsen in any way. Contact information: [address] [id number] [place] [address] [phone]              Reviewed expectations re: course of current medical issues. Questions answered. Outlined signs and symptoms indicating need for more acute intervention. Patient verbalized understanding. After Visit Summary given.   SUBJECTIVE:  History from: patient. Jamie Robinson is a 45 y.o. male who presents with complaint of single episode of left upper chest pain approx one hour ago; lasted sev minutes then resolved without recurrence. Felt it in L arm also; questions pain of R neck also. No pain with neck or shoulder  movements. Afebrile. No recent illnesses. Social History   Tobacco Use  Smoking Status Current Every Day Smoker  . Types: Cigarettes  Smokeless Tobacco Never Used   Denies: dyspnea, exertional chest pressure/discomfort, fatigue, irregular heart beat, lower extremity edema, near-syncope, orthopnea, palpitations, paroxysmal nocturnal dyspnea and syncope. Illicit drug use: denied.  OBJECTIVE:  Vitals:   09/16/20 1543  BP: 117/81  Pulse: 86  Resp: 20  Temp: 98.6 F (37 C)  TempSrc: Oral  SpO2: 100%    General appearance: alert, oriented, no acute distress Eyes: PERRLA; EOMI; conjunctivae normal HENT: normocephalic; atraumatic Neck: supple with FROM Lungs: without labored respirations; speaks full sentences without difficulty; CTAB Heart: regular rate and rhythm without murmer Chest Wall: without tenderness to palpation Abdomen: soft, non-tender; no guarding or rebound tenderness Extremities: without edema; without calf swelling or tenderness; symmetrical without gross deformities Skin: warm and dry; without rash or lesions Neuro: normal gait Psychological: alert and cooperative; normal mood and affect    No Known Allergies  Past Medical History:  Diagnosis Date  . Back pain    Social History   Socioeconomic History  . Marital status: Divorced    Spouse name: Not on file  . Number of children: Not on file  . Years of education: Not on file  . Highest education level: Not on file  Occupational History  . Not on file  Tobacco Use  . Smoking status: Current Every Day Smoker    Types: Cigarettes  . Smokeless tobacco: Never Used  Vaping Use  . Vaping Use: Never used  Substance and Sexual Activity  .  Alcohol use: Yes    Comment: occ  . Drug use: Yes    Types: Marijuana  . Sexual activity: Not on file  Other Topics Concern  . Not on file  Social History Narrative  . Not on file   Social Determinants of Health   Financial Resource Strain: Not on file  Food  Insecurity: Not on file  Transportation Needs: Not on file  Physical Activity: Not on file  Stress: Not on file  Social Connections: Not on file  Intimate Partner Violence: Not on file   No family history on file. History reviewed. No pertinent surgical history.   Mardella Layman, MD 09/16/20 [phone]

## 2020-09-16 NOTE — Discharge Instructions (Signed)
You have been seen at the [facility] today for chest pain. Your evaluation today was not suggestive of any emergent condition requiring medical intervention at this time. Your ECG (heart tracing) did not show any worrisome changes. However, some medical problems make take more time to appear. Therefore, it's very important that you pay attention to any new symptoms or worsening of your current condition.  Please proceed directly to the Emergency Department immediately should you feel worse in any way or have any of the following symptoms: any recurrence of chest pain, back or abdomen pain, shortness of breath, or nausea and vomiting.

## 2020-09-16 NOTE — ED Triage Notes (Signed)
Pt presents today with c/o of an episode of chest/neck pain that occurred about one hour ago. Now c/o left arm pain. Denies n/v/d. Denies SOB.

## 2020-11-02 ENCOUNTER — Encounter (HOSPITAL_COMMUNITY): Payer: Self-pay | Admitting: *Deleted

## 2020-11-02 ENCOUNTER — Emergency Department (HOSPITAL_COMMUNITY): Payer: No Typology Code available for payment source

## 2020-11-02 ENCOUNTER — Emergency Department (HOSPITAL_COMMUNITY)
Admission: EM | Admit: 2020-11-02 | Discharge: 2020-11-02 | Disposition: A | Discharge status: Home / Self Care | Payer: No Typology Code available for payment source | Attending: Student | Admitting: Student

## 2020-11-02 DIAGNOSIS — Z5321 Procedure and treatment not carried out due to patient leaving prior to being seen by health care provider: Secondary | ICD-10-CM | POA: Insufficient documentation

## 2020-11-02 DIAGNOSIS — M546 Pain in thoracic spine: Secondary | ICD-10-CM | POA: Diagnosis present

## 2020-11-02 DIAGNOSIS — W1830XA Fall on same level, unspecified, initial encounter: Secondary | ICD-10-CM | POA: Insufficient documentation

## 2020-11-02 NOTE — ED Notes (Signed)
Pt states he is leaving d/t wait time

## 2020-11-02 NOTE — ED Triage Notes (Signed)
Pt reports upper back spasms since yesterday. Pt reported having a fall last night and landed on right side of his back. Ambulatory at triage.

## 2020-11-02 NOTE — ED Provider Notes (Signed)
Emergency Medicine Provider Triage Evaluation Note  Jamie Robinson , a 45 y.o. male  was evaluated in triage. Patient complaints of right upper back pain- feels like spasms, worse with movement, no alleviating factors. Has had a cough recently.   Review of Systems  Positive: Back pain, cough Negative: Fever, chest pain, hemoptysis, numbness, weakness.   Physical Exam  BP 125/83   Pulse 68   Temp (!) 97.5 F (36.4 C) (Oral)   Resp 16   SpO2 96%  Gen:   Awake, no distress   Resp:  Normal effort  MSK:   Moves extremities without difficulty. Right trapezius/thoracic paraspinal muscle tenderness to palpation.  Other:  Heart: RRR, lungs CTA.   Medical Decision Making  Medically screening exam initiated at 2:50 AM.  Appropriate orders placed.  Jamie Robinson was informed that the remainder of the evaluation will be completed by another provider, this initial triage assessment does not replace that evaluation, and the importance of remaining in the ED until their evaluation is complete.  Back pain.    Desmond Lope 11/02/20 0327    Marily Memos, MD 11/02/20 [phone]

## 2021-01-25 ENCOUNTER — Encounter (HOSPITAL_COMMUNITY): Payer: Self-pay | Admitting: Emergency Medicine

## 2021-01-25 ENCOUNTER — Other Ambulatory Visit: Payer: Self-pay

## 2021-01-25 ENCOUNTER — Ambulatory Visit (HOSPITAL_COMMUNITY)
Admission: EM | Admit: 2021-01-25 | Discharge: 2021-01-25 | Disposition: A | Discharge status: Home / Self Care | Payer: No Typology Code available for payment source | Attending: Emergency Medicine | Admitting: Emergency Medicine

## 2021-01-25 DIAGNOSIS — H5789 Other specified disorders of eye and adnexa: Secondary | ICD-10-CM

## 2021-01-25 MED ORDER — KETOROLAC TROMETHAMINE 0.5 % OP SOLN: 1.0000 [drp] | Freq: Four times a day (QID) | OPHTHALMIC | 0 refills | Status: DC

## 2021-01-25 MED ORDER — TETRACAINE HCL 0.5 % OP SOLN
OPHTHALMIC | Status: AC
  Filled 2021-01-25: qty 4

## 2021-01-25 MED ORDER — EYE WASH OPHTH SOLN
OPHTHALMIC | Status: AC
  Filled 2021-01-25: qty 118

## 2021-01-25 NOTE — Discharge Instructions (Addendum)
Take eye drops every 6 hours as needed to help with pain   If symptoms do not resolve with 48 hours please follow up with eye doctor , if symptoms worsen or have visual changes  please go to nearest emergency department for evaluation, if discharge, itching occur you may return to urgent care for evaluation

## 2021-01-25 NOTE — ED Triage Notes (Signed)
Pt reports waking up this morning with right eye red/pink and irritating. Denies visual problems.

## 2021-01-25 NOTE — ED Provider Notes (Signed)
[facility]    CSN: [id number] Arrival date & time: 01/25/21  4008      History   Chief Complaint Chief Complaint  Patient presents with   Conjunctivitis    HPI DARA CAMARGO is a 45 y.o. male.   Patient pressents with right eye irritation that began this mroning upon awakening. Endorses pain, tearing, and redness. Denies blurred vision, floaters, sensation of foreign body ,discharge. Has occurred before, usually resolves spontaneously within a few hours. Has not attempted treatment.   Past Medical History:  Diagnosis Date   Back pain     There are no problems to display for this patient.   History reviewed. No pertinent surgical history.     Home Medications    Prior to Admission medications   Medication Sig Start Date End Date Taking? Authorizing Provider  ketorolac (ACULAR) 0.5 % ophthalmic solution Place 1 drop into both eyes every 6 (six) hours. 01/25/21  Yes Valinda Hoar, NP  aluminum sulfate-calcium acetate (DOMEBORO) packet Placed packet contents in a few inches of warm water in the tub for sit bath Patient not taking: Reported on 08/19/2020 01/16/20   Merrilee Jansky, MD  Cholecalciferol 50 MCG (2000 UT) TABS Take 2,000 Units by mouth daily. 05/17/20   [provider]  cyclobenzaprine (FLEXERIL) 10 MG tablet Take 10 mg by mouth 3 (three) times daily as needed for muscle spasms. 05/10/20   [provider]  hydrocortisone (ANUSOL-HC) 2.5 % rectal cream Place 1 application rectally 3 (three) times daily. Patient not taking: Reported on 08/19/2020 01/16/20   Merrilee Jansky, MD  prazosin (MINIPRESS) 1 MG capsule Take 1 mg by mouth at bedtime. 05/19/20   [provider]  psyllium (METAMUCIL) 58.6 % packet Take 1 packet by mouth daily as needed (constipation). 02/13/20   [provider]  QUEtiapine (SEROQUEL) 25 MG tablet Take 25 mg by mouth at bedtime. 05/19/20   [provider]  sertraline (ZOLOFT) 50 MG  tablet Take 25 mg by mouth daily. 05/19/20   [provider]    Family History No family history on file.  Social History Social History   Tobacco Use   Smoking status: Every Day    Types: Cigarettes   Smokeless tobacco: Never  Vaping Use   Vaping Use: Never used  Substance Use Topics   Alcohol use: Yes    Comment: occ   Drug use: Yes    Types: Marijuana     Allergies   Patient has no known allergies.   Review of Systems Review of Systems  Constitutional: Negative.   HENT: Negative.    Eyes:  Positive for pain and redness. Negative for photophobia, discharge, itching and visual disturbance.  Skin: Negative.     Physical Exam Triage Vital Signs ED Triage Vitals  Enc Vitals Group     BP 01/25/21 0909 128/70     Pulse Rate 01/25/21 0909 69     Resp 01/25/21 0909 17     Temp 01/25/21 0909 98.2 F (36.8 C)     Temp Source 01/25/21 0909 Oral     SpO2 01/25/21 0909 99 %     Weight --      Height --      Head Circumference --      Peak Flow --      Pain Score 01/25/21 0908 8     Pain Loc --      Pain Edu? --  Excl. in GC? --    No data found.  Updated Vital Signs BP 128/70 (BP Location: Left Arm)   Pulse 69   Temp 98.2 F (36.8 C) (Oral)   Resp 17   SpO2 99%   Visual Acuity Right Eye Distance:   Left Eye Distance:   Bilateral Distance:    Right Eye Near:   Left Eye Near:    Bilateral Near:     Physical Exam Constitutional:      Appearance: Normal appearance. He is normal weight.  HENT:     Head: Normocephalic.  Eyes:     Comments: Diffuse erythema throughout sclera, no swelling, tenderness, visualize of foreign body noted, visually intact   Neurological:     Mental Status: He is alert.     UC Treatments / Results  Labs (all labs ordered are listed, but only abnormal results are displayed) Labs Reviewed - No data to display  EKG   Radiology No results found.  Procedures Procedures (including critical care  time)  Medications Ordered in UC Medications - No data to display  Initial Impression / Assessment and Plan / UC Course  I have reviewed the triage vital signs and the nursing notes.  Pertinent labs & imaging results that were available during my care of the patient were reviewed by me and considered in my medical decision making (see chart for details).  Irritation of right eye  Symptoms starting spontaneously with no sensation of foreign body, will defer fluorescein staining at this time, no signs of infection stable vital signs, patient given strict return precautions to follow-up with persistent symptoms, given return precautions to follow-up with ophthalmologist or emergency department for visual disturbance or worsening pain  1.  Eyewash completed with 10 mL of solution 2.  Ketorolac 0.5 ophthalmic solution 1 drop every 6 hours as needed Final Clinical Impressions(s) / UC Diagnoses   Final diagnoses:  Irritation of right eye     Discharge Instructions      Take eye drops every 6 hours as needed to help with pain   If symptoms do not resolve with 48 hours please follow up with eye doctor , if symptoms worsen or have visual changes  please go to nearest emergency department for evaluation, if discharge, itching occur you may return to urgent care for evaluation     ED Prescriptions     Medication Sig Dispense Auth. Provider   ketorolac (ACULAR) 0.5 % ophthalmic solution Place 1 drop into both eyes every 6 (six) hours. 5 mL Valinda Hoar, NP      PDMP not reviewed this encounter.   Valinda Hoar, NP 01/25/21 1029

## 2021-06-18 ENCOUNTER — Encounter (HOSPITAL_COMMUNITY): Payer: Self-pay

## 2021-06-18 ENCOUNTER — Telehealth (HOSPITAL_COMMUNITY): Payer: Self-pay

## 2021-06-18 ENCOUNTER — Ambulatory Visit (HOSPITAL_COMMUNITY)
Admission: EM | Admit: 2021-06-18 | Discharge: 2021-06-18 | Disposition: A | Discharge status: Home / Self Care | Payer: No Typology Code available for payment source | Attending: Emergency Medicine | Admitting: Emergency Medicine

## 2021-06-18 ENCOUNTER — Other Ambulatory Visit: Payer: Self-pay

## 2021-06-18 DIAGNOSIS — H1031 Unspecified acute conjunctivitis, right eye: Secondary | ICD-10-CM | POA: Diagnosis not present

## 2021-06-18 MED ORDER — GENTAMICIN SULFATE 0.3 % OP SOLN: 1.0000 [drp] | OPHTHALMIC | 0 refills | Status: DC

## 2021-06-18 MED ORDER — GENTAMICIN SULFATE 0.3 % OP SOLN: 1.0000 [drp] | OPHTHALMIC | 0 refills | Status: AC

## 2021-06-18 NOTE — ED Provider Notes (Signed)
[facility]   [id number] 06/18/21 Arrival Time: 1021  Chief Complaint  Patient presents with   Conjunctivitis     SUBJECTIVE:  Jamie Robinson is a 46 y.o. male who presented to the urgent care with a complaint of right irritation and redness that started this morning.  Denies a precipitating event, trauma, or close contacts with similar symptoms.  Has not tried any OTC medication.  Denies alleviating or aggravating factors.  Denies similar symptoms in the past.  Denies fever, chills, nausea, vomiting, eye pain, painful eye movements, halos, discharge, itching, vision changes, double vision, FB sensation, periorbital erythema.     Denies contact lens use.    ROS: As per HPI.  All other pertinent ROS negative.     Past Medical History:  Diagnosis Date   Back pain    History reviewed. No pertinent surgical history. No Known Allergies No current facility-administered medications on file prior to encounter.   Current Outpatient Medications on File Prior to Encounter  Medication Sig Dispense Refill   aluminum sulfate-calcium acetate (DOMEBORO) packet Placed packet contents in a few inches of warm water in the tub for sit bath (Patient not taking: Reported on 08/19/2020) 100 each 0   Cholecalciferol 50 MCG (2000 UT) TABS Take 2,000 Units by mouth daily.     cyclobenzaprine (FLEXERIL) 10 MG tablet Take 10 mg by mouth 3 (three) times daily as needed for muscle spasms.     hydrocortisone (ANUSOL-HC) 2.5 % rectal cream Place 1 application rectally 3 (three) times daily. (Patient not taking: Reported on 08/19/2020) 30 g 0   ketorolac (ACULAR) 0.5 % ophthalmic solution Place 1 drop into both eyes every 6 (six) hours. 5 mL 0   prazosin (MINIPRESS) 1 MG capsule Take 1 mg by mouth at bedtime.     psyllium (METAMUCIL) 58.6 % packet Take 1 packet by mouth daily as needed (constipation).     QUEtiapine (SEROQUEL) 25 MG tablet Take 25 mg by mouth at bedtime.     sertraline (ZOLOFT) 50 MG  tablet Take 25 mg by mouth daily.     Social History   Socioeconomic History   Marital status: Divorced    Spouse name: Not on file   Number of children: Not on file   Years of education: Not on file   Highest education level: Not on file  Occupational History   Not on file  Tobacco Use   Smoking status: Every Day    Types: Cigarettes   Smokeless tobacco: Never  Vaping Use   Vaping Use: Never used  Substance and Sexual Activity   Alcohol use: Yes    Comment: occ   Drug use: Yes    Types: Marijuana   Sexual activity: Not on file  Other Topics Concern   Not on file  Social History Narrative   Not on file   Social Determinants of Health   Financial Resource Strain: Not on file  Food Insecurity: Not on file  Transportation Needs: Not on file  Physical Activity: Not on file  Stress: Not on file  Social Connections: Not on file  Intimate Partner Violence: Not on file   History reviewed. No pertinent family history.  OBJECTIVE:    Visual Acuity  Right Eye Distance:   Left Eye Distance:   Bilateral Distance:    Right Eye Near:   Left Eye Near:    Bilateral Near:      Vitals:   06/18/21 1125  BP: 119/79  Pulse:  81  Resp: 16  Temp: 98.4 F (36.9 C)  TempSrc: Oral  SpO2: 100%    Physical Exam Vitals and nursing note reviewed.  Constitutional:      General: He is not in acute distress.    Appearance: Normal appearance. He is normal weight. He is not ill-appearing, toxic-appearing or diaphoretic.  Eyes:     General: Lids are normal. Lids are everted, no foreign bodies appreciated. Vision grossly intact. Gaze aligned appropriately. No visual field deficit.       Right eye: Discharge present. No foreign body or hordeolum.        Left eye: No foreign body, discharge or hordeolum.     Conjunctiva/sclera:     Right eye: Exudate present.     Comments: Clear while we discharge   Cardiovascular:     Rate and Rhythm: Normal rate and regular rhythm.     Pulses:  Normal pulses.     Heart sounds: Normal heart sounds. No murmur heard.   No friction rub. No gallop.  Pulmonary:     Effort: Pulmonary effort is normal. No respiratory distress.     Breath sounds: Normal breath sounds. No stridor. No wheezing, rhonchi or rales.  Chest:     Chest wall: No tenderness.  Neurological:     Mental Status: He is alert and oriented to person, place, and time.      ASSESSMENT & PLAN:  1. Acute bacterial conjunctivitis of right eye     Meds ordered this encounter  Medications   gentamicin (GARAMYCIN) 0.3 % ophthalmic solution    Sig: Place 1 drop into both eyes every 4 (four) hours for 7 days.    Dispense:  2.1 mL    Refill:  0     Discharge instructions  Use eye drops as prescribed and to completion Dispose of old contacts and wear glasses until you have finished course of antibiotic eye drops Wash pillow cases, wash hands regularly with soap and water, avoid touching your face and eyes, wash door handles, light switches, remotes and other objects you frequently touch Return or follow up with PCP if symptoms persists such as fever, chills, redness, swelling, eye pain, painful eye movements, vision changes, etc...  Reviewed expectations re: course of current medical issues. Questions answered. Outlined signs and symptoms indicating need for more acute intervention. Patient verbalized understanding. After Visit Summary given.    Durward Parcel, FNP 06/18/21 1158

## 2021-06-18 NOTE — Discharge Instructions (Addendum)
Use eye drops as prescribed and to completion Dispose of old contacts and wear glasses until you have finished course of antibiotic eye drops Wash pillow cases, wash hands regularly with soap and water, avoid touching your face and eyes, wash door handles, light switches, remotes and other objects you frequently touch Return or follow up with PCP if symptoms persists such as fever, chills, redness, swelling, eye pain, painful eye movements, vision changes, etc... 

## 2021-06-18 NOTE — ED Triage Notes (Signed)
Pt woke up this morning with some right  eye irritation and redness.

## 2021-12-16 ENCOUNTER — Emergency Department (HOSPITAL_COMMUNITY)
Admission: EM | Admit: 2021-12-16 | Discharge: 2021-12-16 | Disposition: A | Discharge status: Home / Self Care | Payer: No Typology Code available for payment source | Attending: Emergency Medicine | Admitting: Emergency Medicine

## 2021-12-16 ENCOUNTER — Other Ambulatory Visit: Payer: Self-pay

## 2021-12-16 ENCOUNTER — Encounter (HOSPITAL_COMMUNITY): Payer: Self-pay | Admitting: Emergency Medicine

## 2021-12-16 ENCOUNTER — Emergency Department (HOSPITAL_COMMUNITY): Payer: No Typology Code available for payment source

## 2021-12-16 DIAGNOSIS — R519 Headache, unspecified: Secondary | ICD-10-CM | POA: Diagnosis present

## 2021-12-16 DIAGNOSIS — G44209 Tension-type headache, unspecified, not intractable: Secondary | ICD-10-CM | POA: Diagnosis not present

## 2021-12-16 LAB — CBC
HCT: 45.3 % (ref 39.0–52.0)
Hemoglobin: 16.5 g/dL (ref 13.0–17.0)
MCH: 28.8 pg (ref 26.0–34.0)
MCHC: 36.4 g/dL — ABNORMAL HIGH (ref 30.0–36.0)
MCV: 79.2 fL — ABNORMAL LOW (ref 80.0–100.0)
Platelets: 194 10*3/uL (ref 150–400)
RBC: 5.72 MIL/uL (ref 4.22–5.81)
RDW: 12.2 % (ref 11.5–15.5)
WBC: 3.8 10*3/uL — ABNORMAL LOW (ref 4.0–10.5)
nRBC: 0 % (ref 0.0–0.2)

## 2021-12-16 LAB — BASIC METABOLIC PANEL
Anion gap: 8 (ref 5–15)
BUN: <5 mg/dL — ABNORMAL LOW (ref 6–20)
CO2: 29 mmol/L (ref 22–32)
Calcium: 9.8 mg/dL (ref 8.9–10.3)
Chloride: 103 mmol/L (ref 98–111)
Creatinine, Ser: 1.18 mg/dL (ref 0.61–1.24)
GFR, Estimated: >60 mL/min (ref >60)
Glucose, Bld: 89 mg/dL (ref 70–99)
Potassium: 4.1 mmol/L (ref 3.5–5.1)
Sodium: 140 mmol/L (ref 135–145)

## 2021-12-16 NOTE — ED Triage Notes (Signed)
Patient here with complaint of intermittent headache that started several weeks ago but has gotten progressively worse and more frequent. Patient denies headache right now.

## 2021-12-16 NOTE — ED Provider Notes (Signed)
[facility] EMERGENCY DEPARTMENT Provider Note   CSN: [id number] Arrival date & time: 12/16/21  1349     History  Chief Complaint  Patient presents with   Headache    Jamie Robinson is a 46 y.o. male with history of migraines presenting to the emergency department with headache.  Patient reports intermittent headache for the past few weeks.  The headache is worse when he is leaning forward in bed looking at his tablet.  He notes that he does not get the headaches when he is watching TV sitting on the couch or using tablet on the couch.  No fevers, chills, nausea or vomiting, visual changes, eye pain during headaches, numbness, tingling, weakness, syncope.  Headache is gradual onset and completely resolved.  He currently has no headache or any other symptoms.  He reports that it sometimes feels similar to prior migraines.   Headache      Home Medications Prior to Admission medications   Medication Sig Start Date End Date Taking? Authorizing Provider  aluminum sulfate-calcium acetate (DOMEBORO) packet Placed packet contents in a few inches of warm water in the tub for sit bath Patient not taking: Reported on 08/19/2020 01/16/20   Merrilee Jansky, MD  Cholecalciferol 50 MCG (2000 UT) TABS Take 2,000 Units by mouth daily. 05/17/20   [provider]  cyclobenzaprine (FLEXERIL) 10 MG tablet Take 10 mg by mouth 3 (three) times daily as needed for muscle spasms. 05/10/20   [provider]  hydrocortisone (ANUSOL-HC) 2.5 % rectal cream Place 1 application rectally 3 (three) times daily. Patient not taking: Reported on 08/19/2020 01/16/20   Merrilee Jansky, MD  ketorolac (ACULAR) 0.5 % ophthalmic solution Place 1 drop into both eyes every 6 (six) hours. 01/25/21   White, Elita Boone, NP  prazosin (MINIPRESS) 1 MG capsule Take 1 mg by mouth at bedtime. 05/19/20   [provider]  psyllium (METAMUCIL) 58.6 % packet Take 1 packet by mouth daily as  needed (constipation). 02/13/20   [provider]  QUEtiapine (SEROQUEL) 25 MG tablet Take 25 mg by mouth at bedtime. 05/19/20   [provider]  sertraline (ZOLOFT) 50 MG tablet Take 25 mg by mouth daily. 05/19/20   [provider]      Allergies    Patient has no known allergies.    Review of Systems   Review of Systems  Neurological:  Positive for headaches.  See HPI  Physical Exam Updated Vital Signs BP 131/89 (BP Location: Right Arm)   Pulse 60   Temp 98.6 F (37 C) (Oral)   Resp 18   SpO2 100%  Physical Exam Vitals and nursing note reviewed.  Constitutional:      General: He is not in acute distress.    Appearance: Normal appearance.  HENT:     Mouth/Throat:     Mouth: Mucous membranes are moist.  Cardiovascular:     Rate and Rhythm: Normal rate and regular rhythm.  Pulmonary:     Effort: Pulmonary effort is normal. No respiratory distress.     Breath sounds: Normal breath sounds.  Abdominal:     General: Abdomen is flat.     Palpations: Abdomen is soft.     Tenderness: There is no abdominal tenderness.  Skin:    General: Skin is warm and dry.     Capillary Refill: Capillary refill takes less than 2 seconds.  Neurological:     Mental Status: He is alert and oriented  to person, place, and time. Mental status is at baseline.     Comments: Cranial nerves II through XII intact, strength 5 out of 5 in the bilateral upper and lower extremities, no sensory deficit to light touch, no dysmetria on finger-nose-finger testing, ambulatory with steady gait.   Psychiatric:        Mood and Affect: Mood normal.        Behavior: Behavior normal.     ED Results / Procedures / Treatments   Labs (all labs ordered are listed, but only abnormal results are displayed) Labs Reviewed  CBC - Abnormal; Notable for the following components:      Result Value   WBC 3.8 (*)    MCV 79.2 (*)    MCHC 36.4 (*)    All other components within normal limits   BASIC METABOLIC PANEL - Abnormal; Notable for the following components:   BUN <5 (*)    All other components within normal limits    EKG None  Radiology CT Head Wo Contrast  Result Date: 12/16/2021 CLINICAL DATA:  Recent headaches occurring more frequently over the last 3 weeks. EXAM: CT HEAD WITHOUT CONTRAST TECHNIQUE: Contiguous axial images were obtained from the base of the skull through the vertex without intravenous contrast. RADIATION DOSE REDUCTION: This exam was performed according to the departmental dose-optimization program which includes automated exposure control, adjustment of the mA and/or kV according to patient size and/or use of iterative reconstruction technique. COMPARISON:  None Available. FINDINGS: Brain: The brainstem, cerebellum, cerebral peduncles, thalami, basal ganglia, basilar cisterns, and ventricular system appear within normal limits. No intracranial hemorrhage, mass lesion, or acute CVA. Vascular: Unremarkable Skull: Unremarkable Sinuses/Orbits: Unremarkable Other: No supplemental non-categorized findings. IMPRESSION: No significant abnormality is identified to explain the patient's headaches. Electronically Signed   By: Gaylyn Rong M.D.   On: 12/16/2021 14:57    Procedures Procedures    Medications Ordered in ED Medications - No data to display  ED Course/ Medical Decision Making/ A&P                           Medical Decision Making  History most consistent with tension headache. Differential includes tension headache versus migraine headache.  Low concern for dangerous cause of headache such as intracranial mass or bleeding given history and exam, intracranial infectious process such as meningitis or encephalitis without fever or meningismus, or subarachnoid hemorrhage given gradual onset. Doubt carbon monoxide poisoning, temporal arteritis or glaucoma given history.  I reviewed CT scan which appears negative, confirmed by radiology.  I reviewed  lab testing which was overall unremarkable. Advised to avoid posture which normally triggers headache. Will discharge patient to home. All questions answered. Patient comfortable with plan of discharge. Return precautions discussed with patient and specified on the after visit summary.    Final Clinical Impression(s) / ED Diagnoses Final diagnoses:  Tension headache    Rx / DC Orders ED Discharge Orders     None         Lonell Grandchild, MD 12/16/21 1656

## 2021-12-16 NOTE — ED Notes (Signed)
Patient reports headache starts after sitting for a long time, looking down while working on his tablet. Hot showers, lying flat in the floor, and lidocaine patches help.

## 2021-12-16 NOTE — ED Provider Triage Note (Signed)
Emergency Medicine Provider Triage Evaluation Note  Jamie Robinson , a 46 y.o. male  was evaluated in triage.  Pt complains of headache. States that same is not present currently, but has been occurring more frequently over the past 3 weeks. States that it is very positional in nature and located on the left side of his head. Hx of migraines but states that this is worse and more frequent. Denies fevers, chills, neck pain, blurred vision, photophobia, nausea, vomiting  Review of Systems  Positive:  Negative:   Physical Exam  BP 131/89 (BP Location: Right Arm)   Pulse 60   Temp 98.6 F (37 C) (Oral)   Resp 18   SpO2 100%  Gen:   Awake, no distress   Resp:  Normal effort  MSK:   Moves extremities without difficulty  Other:    Medical Decision Making  Medically screening exam initiated at 2:29 PM.  Appropriate orders placed.  Jamie Robinson was informed that the remainder of the evaluation will be completed by another provider, this initial triage assessment does not replace that evaluation, and the importance of remaining in the ED until their evaluation is complete.     Silva Bandy, PA-C 12/16/21 1430

## 2022-03-17 ENCOUNTER — Emergency Department (HOSPITAL_COMMUNITY)
Admission: EM | Admit: 2022-03-17 | Discharge: 2022-03-18 | Discharge status: Against Medical Advice | Payer: No Typology Code available for payment source

## 2022-03-18 NOTE — ED Notes (Addendum)
Pt called multiple times to triage room, no answer. Checked the alcoves, no where to be found.

## 2022-05-30 ENCOUNTER — Encounter (HOSPITAL_COMMUNITY): Payer: Self-pay

## 2022-05-30 ENCOUNTER — Ambulatory Visit (HOSPITAL_COMMUNITY)
Admission: EM | Admit: 2022-05-30 | Discharge: 2022-05-30 | Disposition: A | Discharge status: Home / Self Care | Payer: No Typology Code available for payment source | Attending: Family Medicine | Admitting: Family Medicine

## 2022-05-30 DIAGNOSIS — H00014 Hordeolum externum left upper eyelid: Secondary | ICD-10-CM | POA: Diagnosis not present

## 2022-05-30 MED ORDER — TOBRAMYCIN 0.3 % OP SOLN: 1.0000 [drp] | Freq: Four times a day (QID) | OPHTHALMIC | 0 refills | Status: DC

## 2022-05-30 NOTE — ED Triage Notes (Signed)
Patient with c/o left eye swelling. Denies vision changes.

## 2022-05-30 NOTE — ED Provider Notes (Signed)
  [place]   [id number] 05/30/22 Arrival Time: 1517  ASSESSMENT & PLAN:  1. Hordeolum externum of left upper eyelid    Begin: Meds ordered this encounter  Medications   tobramycin (TOBREX) 0.3 % ophthalmic solution    Sig: Place 1 drop into the left eye every 6 (six) hours.    Dispense:  5 mL    Refill:  0   Warm compress to eye(s). Local eye care discussed.  Reviewed expectations re: course of current medical issues. Questions answered. Outlined signs and symptoms indicating need for more acute intervention. Patient verbalized understanding. After Visit Summary given.   SUBJECTIVE:  Jamie Robinson is a 47 y.o. male who presents with complaint of stye of L upper lid; h/o in the past; current x 1-2 d. No vision changes. No contact lens use.  OBJECTIVE:  Vitals:   05/30/22 1444  BP: 113/81  Pulse: 63  Resp: 16  Temp: 98.3 F (36.8 C)  TempSrc: Oral  SpO2: 100%    General appearance: alert; no distress HEENT: [place]; AT; PERRLA; no restriction of the extraocular movements OS: without reported pain; without conjunctival injection; without drainage; without corneal opacities; without limbal flush; with hordeolum of L upper medial eyelid Neck: supple without LAD Psychological: alert and cooperative; normal mood and affect    No Known Allergies  Past Medical History:  Diagnosis Date   Back pain    Social History   Socioeconomic History   Marital status: Divorced    Spouse name: Not on file   Number of children: Not on file   Years of education: Not on file   Highest education level: Not on file  Occupational History   Not on file  Tobacco Use   Smoking status: Every Day    Types: Cigarettes   Smokeless tobacco: Never  Vaping Use   Vaping Use: Never used  Substance and Sexual Activity   Alcohol use: Yes    Comment: occ   Drug use: Yes    Types: Marijuana   Sexual activity: Not on file  Other Topics Concern   Not on file  Social History  Narrative   Not on file   Social Determinants of Health   Financial Resource Strain: Not on file  Food Insecurity: Not on file  Transportation Needs: Not on file  Physical Activity: Not on file  Stress: Not on file  Social Connections: Not on file  Intimate Partner Violence: Not on file   No family history on file. History reviewed. No pertinent surgical history.    Vanessa Kick, MD 05/30/22 [phone]

## 2022-06-19 ENCOUNTER — Encounter (HOSPITAL_COMMUNITY): Payer: Self-pay

## 2022-06-19 ENCOUNTER — Ambulatory Visit (HOSPITAL_COMMUNITY)
Admission: RE | Admit: 2022-06-19 | Discharge: 2022-06-19 | Disposition: A | Discharge status: Home / Self Care | Payer: No Typology Code available for payment source | Source: Ambulatory Visit

## 2022-06-19 VITALS — BP 118/79 | HR 74 | Temp 98.7°F | Resp 14

## 2022-06-19 DIAGNOSIS — H00014 Hordeolum externum left upper eyelid: Secondary | ICD-10-CM | POA: Diagnosis not present

## 2022-06-19 NOTE — Discharge Instructions (Addendum)
Continue warm compresses to the upper eye lid. The stye appears to be healing appropriately.

## 2022-06-19 NOTE — ED Provider Notes (Signed)
[place]    CSN: [id number] Arrival date & time: 06/19/22  6269      History   Chief Complaint Chief Complaint  Patient presents with   appt 10    HPI Jamie Robinson is a 47 y.o. male.   Patient presents to urgent care for follow-up evaluation of hordeolum to the right upper eyelid that started approximately 3 weeks ago. He was seen at urgent care May 30, 2022 when symptoms had been present for 1-2 days at that point and was prescribed tobramycin eye drops. Patient used the eyedrops initially, but then suffered a death in the family and missed multiple days of using the medication. He states the stye has improved significantly over the last 3 weeks and is no longer red, swollen, or warm. He has not been performing warm compresses to the area. He has not had any vision changes, dizziness, viral uri symptoms, fever/chills, ear pain, eye drainage, eye redness, or pain to the eye. He does not wear contacts or glasses for vision correction. Reports slight itching sensation to the medial aspect of the left upper eyelid at the location of the hordeolum and states there is a small area of swelling remaining to the left upper eyelid, but this is much improved as compared to initial swelling. He has been using the tobrimycin eye drops as prescribed for the last 2-3 days consistently.      Past Medical History:  Diagnosis Date   Back pain     There are no problems to display for this patient.   History reviewed. No pertinent surgical history.     Home Medications    Prior to Admission medications   Medication Sig Start Date End Date Taking? Authorizing Provider  aluminum sulfate-calcium acetate (DOMEBORO) packet Placed packet contents in a few inches of warm water in the tub for sit bath Patient not taking: Reported on 08/19/2020 01/16/20   Chase Picket, MD  Cholecalciferol 50 MCG (2000 UT) TABS Take 2,000 Units by mouth daily. 05/17/20   [provider]   cyclobenzaprine (FLEXERIL) 10 MG tablet Take 10 mg by mouth 3 (three) times daily as needed for muscle spasms. 05/10/20   [provider]  hydrocortisone (ANUSOL-HC) 2.5 % rectal cream Place 1 application rectally 3 (three) times daily. Patient not taking: Reported on 08/19/2020 01/16/20   Chase Picket, MD  ketorolac (ACULAR) 0.5 % ophthalmic solution Place 1 drop into both eyes every 6 (six) hours. 01/25/21   White, Leitha Schuller, NP  prazosin (MINIPRESS) 1 MG capsule Take 1 mg by mouth at bedtime. 05/19/20   [provider]  psyllium (METAMUCIL) 58.6 % packet Take 1 packet by mouth daily as needed (constipation). 02/13/20   [provider]  QUEtiapine (SEROQUEL) 25 MG tablet Take 25 mg by mouth at bedtime. 05/19/20   [provider]  sertraline (ZOLOFT) 50 MG tablet Take 25 mg by mouth daily. 05/19/20   [provider]  tobramycin (TOBREX) 0.3 % ophthalmic solution Place 1 drop into the left eye every 6 (six) hours. 05/30/22   Vanessa Kick, MD    Family History No family history on file.  Social History Social History   Tobacco Use   Smoking status: Every Day    Types: Cigarettes   Smokeless tobacco: Never  Vaping Use   Vaping Use: Never used  Substance Use Topics   Alcohol use: Yes    Comment: occ   Drug use: Yes  Types: Marijuana     Allergies   Patient has no known allergies.   Review of Systems Review of Systems Per HPI  Physical Exam Triage Vital Signs ED Triage Vitals [06/19/22 1010]  Enc Vitals Group     BP 118/79     Pulse Rate 74     Resp 14     Temp 98.7 F (37.1 C)     Temp Source Oral     SpO2 97 %     Weight      Height      Head Circumference      Peak Flow      Pain Score 0     Pain Loc      Pain Edu?      Excl. in GC?    No data found.  Updated Vital Signs BP 118/79 (BP Location: Left Arm)   Pulse 74   Temp 98.7 F (37.1 C) (Oral)   Resp 14   SpO2 97%   Visual Acuity Right Eye  Distance:   Left Eye Distance:   Bilateral Distance:    Right Eye Near:   Left Eye Near:    Bilateral Near:     Physical Exam Vitals and nursing note reviewed.  Constitutional:      Appearance: He is not ill-appearing or toxic-appearing.  HENT:     Head: Normocephalic and atraumatic.     Right Ear: Hearing and external ear normal.     Left Ear: Hearing and external ear normal.     Nose: Nose normal.     Mouth/Throat:     Lips: Pink.  Eyes:     General: Lids are normal. Vision grossly intact. Gaze aligned appropriately.        Right eye: No foreign body, discharge or hordeolum.        Left eye: Hordeolum present.No foreign body or discharge.     Extraocular Movements: Extraocular movements intact.     Conjunctiva/sclera: Conjunctivae normal.     Left eye: Left conjunctiva is not injected. No chemosis, exudate or hemorrhage.    Comments: EOMs intact without pain or dizziness elicited. Hordeolum to the left upper eyelid is less than 1cm in diameter and appears swollen, however no erythema, pain to palpation, or warmth.   Pulmonary:     Effort: Pulmonary effort is normal.  Musculoskeletal:     Cervical back: Neck supple.  Skin:    General: Skin is warm and dry.     Capillary Refill: Capillary refill takes less than 2 seconds.     Findings: No rash.  Neurological:     General: No focal deficit present.     Mental Status: He is alert and oriented to person, place, and time. Mental status is at baseline.     Cranial Nerves: No dysarthria or facial asymmetry.  Psychiatric:        Mood and Affect: Mood normal.        Speech: Speech normal.        Behavior: Behavior normal.        Thought Content: Thought content normal.        Judgment: Judgment normal.      UC Treatments / Results  Labs (all labs ordered are listed, but only abnormal results are displayed) Labs Reviewed - No data to display  EKG   Radiology No results found.  Procedures Procedures (including  critical care time)  Medications Ordered in UC Medications - No data to display  Initial Impression / Assessment and Plan / UC Course  I have reviewed the triage vital signs and the nursing notes.  Pertinent labs & imaging results that were available during my care of the patient were reviewed by me and considered in my medical decision making (see chart for details).   1. Hordeolum of left upper eyelid Hordeolum appears to be healing appropriately without signs of infection. Advised to stop using antibiotic eye drops as I believe these are no longer indicated due to significant improvement in symptoms and physical exam findings. Warm compresses recommended to further reduce swelling and symptoms. Hordeolum will likely resolve in the next 1-2 weeks.   Discussed physical exam and available lab work findings in clinic with patient.  Counseled patient regarding appropriate use of medications and potential side effects for all medications recommended or prescribed today. Discussed red flag signs and symptoms of worsening condition,when to call the PCP office, return to urgent care, and when to seek higher level of care in the emergency department. Patient verbalizes understanding and agreement with plan. All questions answered. Patient discharged in stable condition.    Final Clinical Impressions(s) / UC Diagnoses   Final diagnoses:  Hordeolum externum of left upper eyelid     Discharge Instructions      Continue warm compresses to the upper eye lid. The stye appears to be healing appropriately.    ED Prescriptions   None    PDMP not reviewed this encounter.   Talbot Grumbling, [place] 06/19/22 1045

## 2022-06-19 NOTE — ED Triage Notes (Signed)
Pt reports that he had issues with left eye sty since last visit. Reports got drops and had interruption due to death  in family with treatment regimen. Denies pain.

## 2022-09-21 IMAGING — DX DG CHEST 2V
2 series · 2 of 2 positions shown · non-contrast
Comparison: 01/12/2030

CLINICAL DATA: Chest pain today.

EXAM:
CHEST - 2 VIEW

[chest pa]
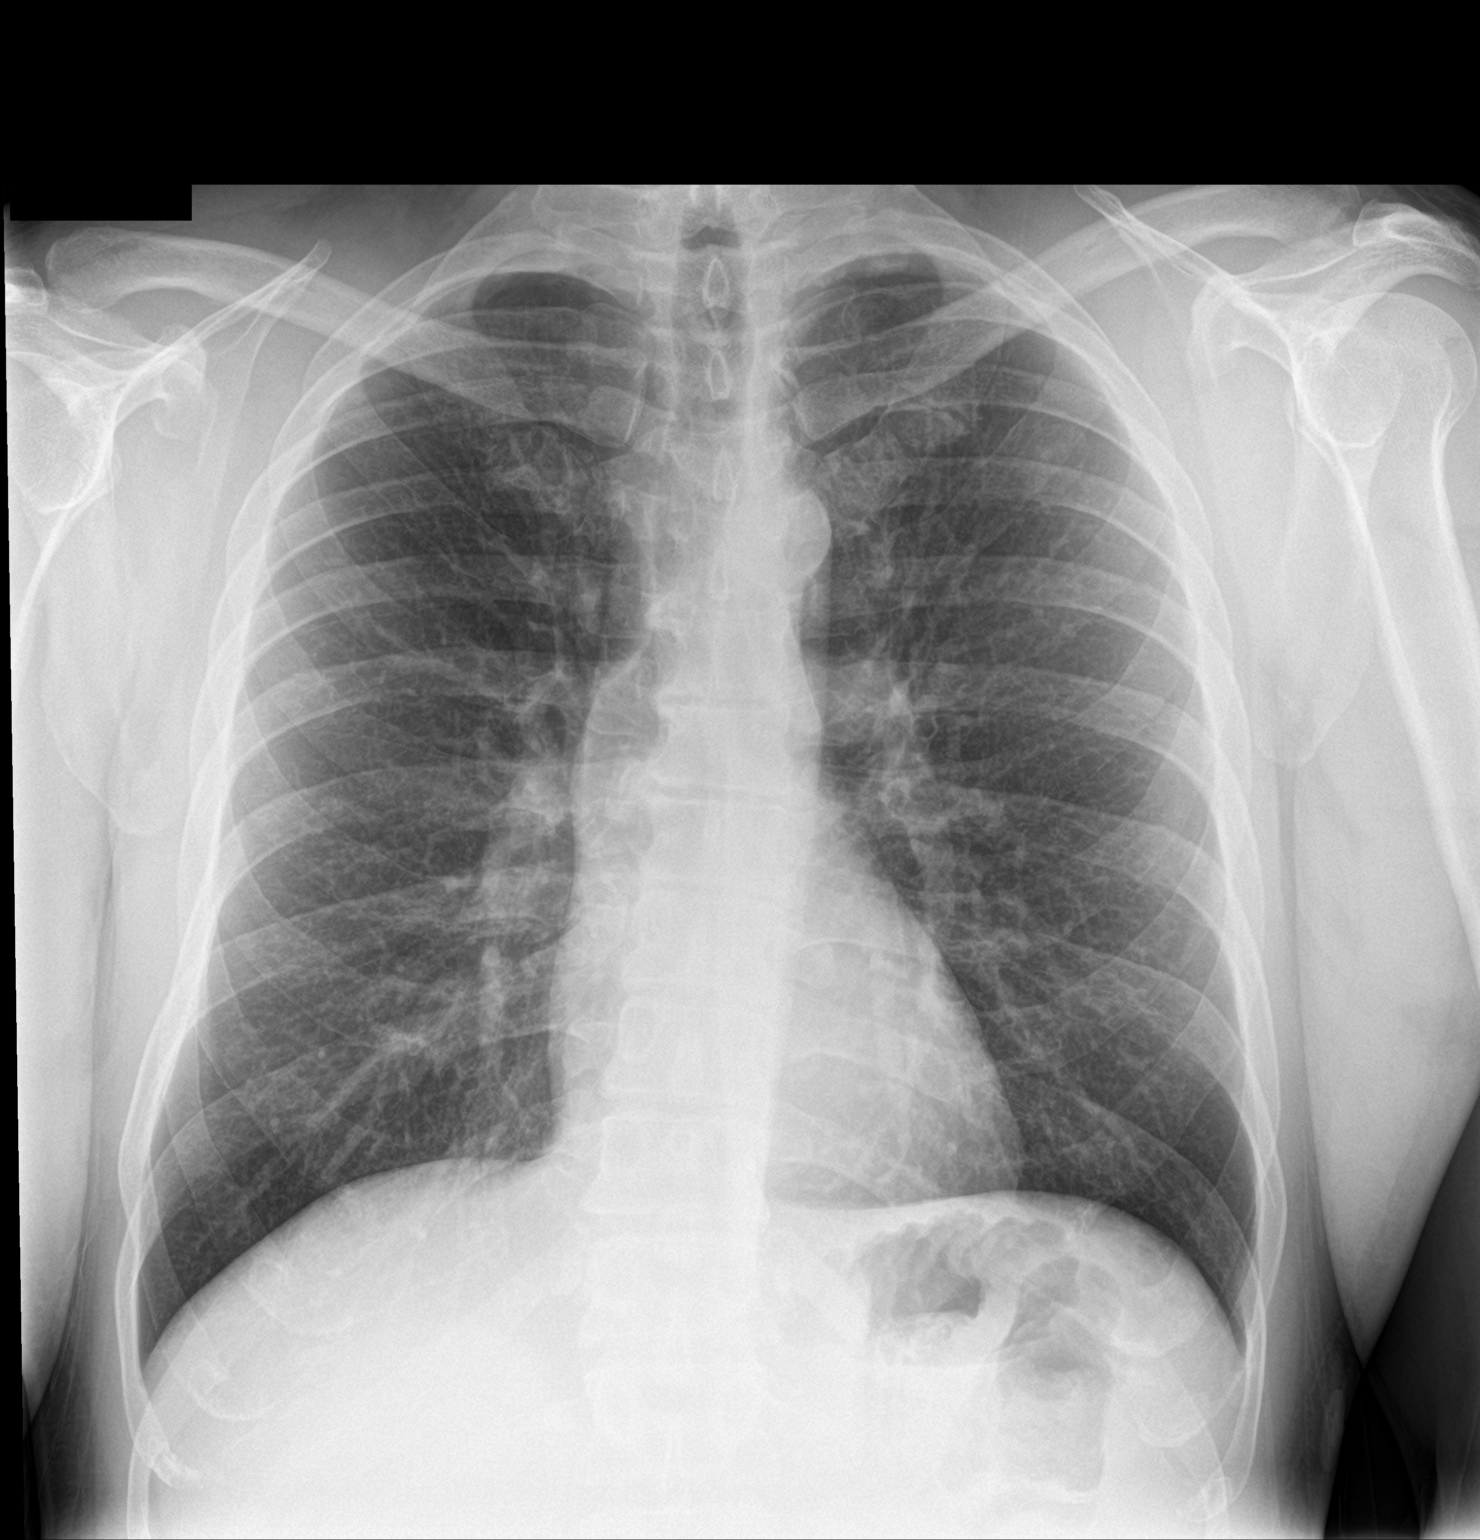

[chest lat]
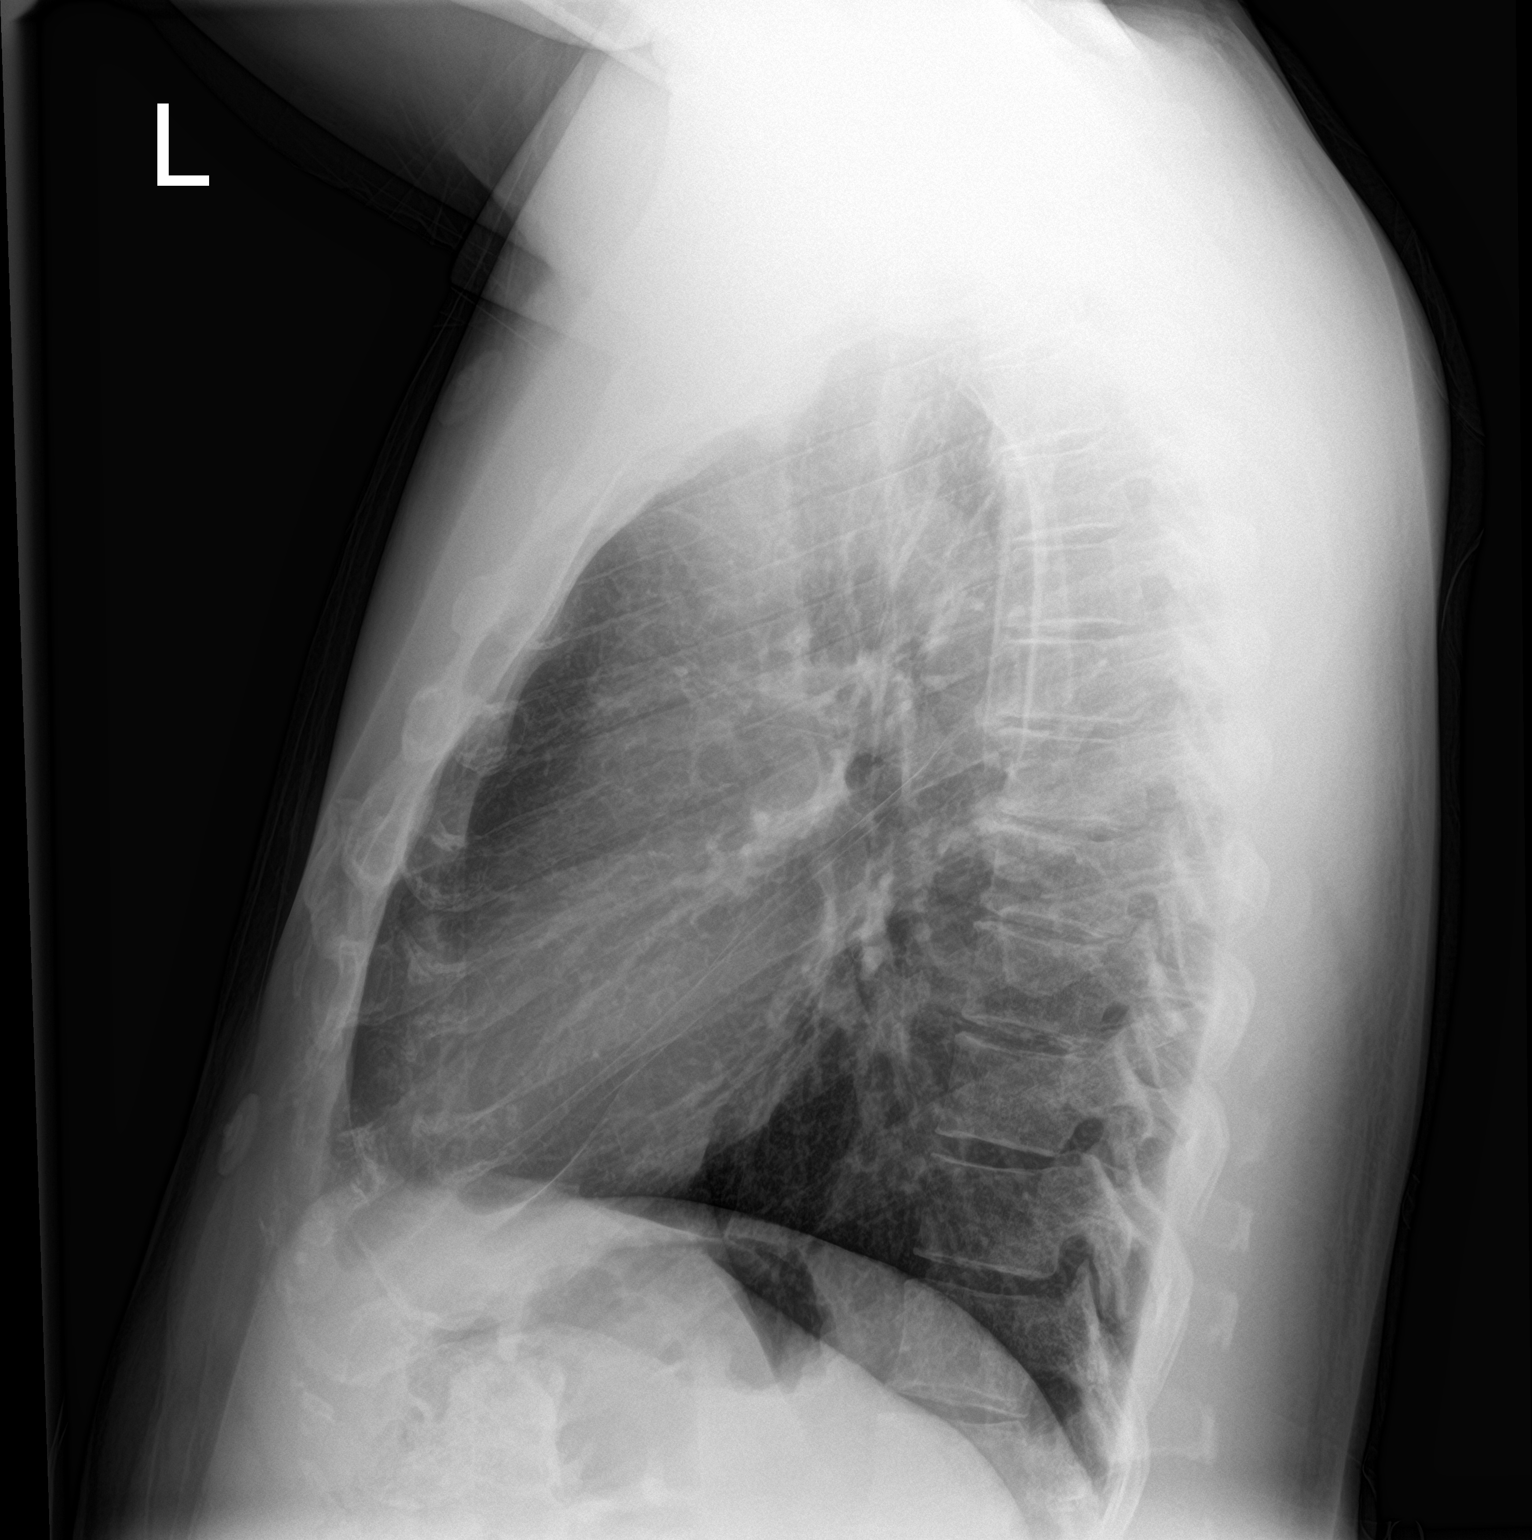

[2 of 2 positions shown; findings below may reference images not displayed]

FINDINGS: The cardiomediastinal contours are normal. The lungs are clear.
Pulmonary vasculature is normal. No consolidation, pleural effusion,
or pneumothorax. No acute osseous abnormalities are seen.
IMPRESSION: Negative radiographs of the chest.

## 2022-10-21 ENCOUNTER — Encounter (HOSPITAL_COMMUNITY): Payer: Self-pay | Admitting: Emergency Medicine

## 2022-10-21 ENCOUNTER — Other Ambulatory Visit: Payer: Self-pay

## 2022-10-21 ENCOUNTER — Emergency Department (HOSPITAL_COMMUNITY): Payer: No Typology Code available for payment source

## 2022-10-21 ENCOUNTER — Emergency Department (HOSPITAL_COMMUNITY)
Admission: EM | Admit: 2022-10-21 | Discharge: 2022-10-22 | Discharge status: Against Medical Advice | Payer: No Typology Code available for payment source | Attending: Emergency Medicine | Admitting: Emergency Medicine

## 2022-10-21 DIAGNOSIS — R079 Chest pain, unspecified: Secondary | ICD-10-CM | POA: Diagnosis not present

## 2022-10-21 DIAGNOSIS — Z5321 Procedure and treatment not carried out due to patient leaving prior to being seen by health care provider: Secondary | ICD-10-CM | POA: Diagnosis not present

## 2022-10-21 DIAGNOSIS — M25531 Pain in right wrist: Secondary | ICD-10-CM | POA: Insufficient documentation

## 2022-10-21 LAB — BASIC METABOLIC PANEL
Anion gap: 9 (ref 5–15)
BUN: 7 mg/dL (ref 6–20)
CO2: 27 mmol/L (ref 22–32)
Calcium: 9.3 mg/dL (ref 8.9–10.3)
Chloride: 103 mmol/L (ref 98–111)
Creatinine, Ser: 1.17 mg/dL (ref 0.61–1.24)
GFR, Estimated: >60 mL/min (ref >60)
Glucose, Bld: 95 mg/dL (ref 70–99)
Potassium: 3.7 mmol/L (ref 3.5–5.1)
Sodium: 139 mmol/L (ref 135–145)

## 2022-10-21 LAB — CBC
HCT: 42.2 % (ref 39.0–52.0)
Hemoglobin: 15.3 g/dL (ref 13.0–17.0)
MCH: 28.7 pg (ref 26.0–34.0)
MCHC: 36.3 g/dL — ABNORMAL HIGH (ref 30.0–36.0)
MCV: 79 fL — ABNORMAL LOW (ref 80.0–100.0)
Platelets: 165 10*3/uL (ref 150–400)
RBC: 5.34 MIL/uL (ref 4.22–5.81)
RDW: 12.7 % (ref 11.5–15.5)
WBC: 4 10*3/uL (ref 4.0–10.5)
nRBC: 0 % (ref 0.0–0.2)

## 2022-10-21 LAB — TROPONIN I (HIGH SENSITIVITY): Troponin I (High Sensitivity): 5 ng/L (ref <18)

## 2022-10-21 NOTE — ED Triage Notes (Signed)
Pt in with R wrist pain x few days, states when the pain surges in his wrist, he feels sharp chest pains. Denies any n/v or sob. Denies any injuries to wrist

## 2022-10-22 ENCOUNTER — Emergency Department (HOSPITAL_COMMUNITY): Payer: No Typology Code available for payment source

## 2022-10-22 ENCOUNTER — Encounter (HOSPITAL_COMMUNITY): Payer: Self-pay | Admitting: *Deleted

## 2022-10-22 ENCOUNTER — Other Ambulatory Visit: Payer: Self-pay

## 2022-10-22 ENCOUNTER — Emergency Department (HOSPITAL_COMMUNITY)
Admission: EM | Admit: 2022-10-22 | Discharge: 2022-10-23 | Disposition: A | Discharge status: Home / Self Care | Payer: No Typology Code available for payment source | Attending: Emergency Medicine | Admitting: Emergency Medicine

## 2022-10-22 DIAGNOSIS — E876 Hypokalemia: Secondary | ICD-10-CM | POA: Diagnosis not present

## 2022-10-22 DIAGNOSIS — N179 Acute kidney failure, unspecified: Secondary | ICD-10-CM | POA: Diagnosis not present

## 2022-10-22 DIAGNOSIS — R112 Nausea with vomiting, unspecified: Secondary | ICD-10-CM | POA: Diagnosis present

## 2022-10-22 HISTORY — DX: Anxiety disorder, unspecified: F41.9

## 2022-10-22 LAB — HEPATIC FUNCTION PANEL
ALT: 17 U/L (ref 0–44)
AST: 34 U/L (ref 15–41)
Albumin: 4.1 g/dL (ref 3.5–5.0)
Alkaline Phosphatase: 53 U/L (ref 38–126)
Bilirubin, Direct: 0.2 mg/dL (ref 0.0–0.2)
Indirect Bilirubin: 0.4 mg/dL (ref 0.3–0.9)
Total Bilirubin: 0.6 mg/dL (ref 0.3–1.2)
Total Protein: 7.1 g/dL (ref 6.5–8.1)

## 2022-10-22 LAB — CBC
HCT: 44.6 % (ref 39.0–52.0)
Hemoglobin: 16.5 g/dL (ref 13.0–17.0)
MCH: 29.6 pg (ref 26.0–34.0)
MCHC: 37 g/dL — ABNORMAL HIGH (ref 30.0–36.0)
MCV: 79.9 fL — ABNORMAL LOW (ref 80.0–100.0)
Platelets: 219 10*3/uL (ref 150–400)
RBC: 5.58 MIL/uL (ref 4.22–5.81)
RDW: 12.8 % (ref 11.5–15.5)
WBC: 8.9 10*3/uL (ref 4.0–10.5)
nRBC: 0 % (ref 0.0–0.2)

## 2022-10-22 LAB — TROPONIN I (HIGH SENSITIVITY): Troponin I (High Sensitivity): 4 ng/L (ref <18)

## 2022-10-22 LAB — LIPASE, BLOOD: Lipase: 33 U/L (ref 11–51)

## 2022-10-22 LAB — BASIC METABOLIC PANEL
Anion gap: 19 — ABNORMAL HIGH (ref 5–15)
BUN: 7 mg/dL (ref 6–20)
CO2: 19 mmol/L — ABNORMAL LOW (ref 22–32)
Calcium: 10 mg/dL (ref 8.9–10.3)
Chloride: 101 mmol/L (ref 98–111)
Creatinine, Ser: 1.34 mg/dL — ABNORMAL HIGH (ref 0.61–1.24)
GFR, Estimated: >60 mL/min (ref >60)
Glucose, Bld: 128 mg/dL — ABNORMAL HIGH (ref 70–99)
Potassium: 3.2 mmol/L — ABNORMAL LOW (ref 3.5–5.1)
Sodium: 139 mmol/L (ref 135–145)

## 2022-10-22 LAB — ETHANOL: Alcohol, Ethyl (B): <10 mg/dL (ref <10)

## 2022-10-22 MED ORDER — ONDANSETRON HCL 4 MG/2ML IJ SOLN
4.0000 mg | Freq: Once | INTRAMUSCULAR | Status: AC
  Administered 2022-10-22: 4 mg via INTRAVENOUS
  Filled 2022-10-22: qty 2

## 2022-10-22 MED ORDER — SODIUM CHLORIDE 0.9 % IV BOLUS
1000.0000 mL | Freq: Once | INTRAVENOUS | Status: AC
  Administered 2022-10-22: 1000 mL via INTRAVENOUS

## 2022-10-22 MED ORDER — FAMOTIDINE IN NACL 20-0.9 MG/50ML-% IV SOLN
20.0000 mg | Freq: Once | INTRAVENOUS | Status: AC
  Administered 2022-10-22: 20 mg via INTRAVENOUS
  Filled 2022-10-22: qty 50

## 2022-10-22 MED ORDER — PANTOPRAZOLE SODIUM 40 MG IV SOLR
40.0000 mg | Freq: Once | INTRAVENOUS | Status: AC
  Administered 2022-10-23: 40 mg via INTRAVENOUS
  Filled 2022-10-22: qty 10

## 2022-10-22 MED ORDER — DROPERIDOL 2.5 MG/ML IJ SOLN
2.5000 mg | Freq: Once | INTRAMUSCULAR | Status: AC
  Administered 2022-10-22: 2.5 mg via INTRAVENOUS
  Filled 2022-10-22: qty 2

## 2022-10-22 MED ORDER — POTASSIUM CHLORIDE 10 MEQ/100ML IV SOLN
10.0000 meq | Freq: Once | INTRAVENOUS | Status: AC
  Administered 2022-10-22: 10 meq via INTRAVENOUS
  Filled 2022-10-22: qty 100

## 2022-10-22 MED ORDER — KETOROLAC TROMETHAMINE 15 MG/ML IJ SOLN
15.0000 mg | Freq: Once | INTRAMUSCULAR | Status: AC
  Administered 2022-10-23: 15 mg via INTRAVENOUS
  Filled 2022-10-22: qty 1

## 2022-10-22 MED ORDER — ONDANSETRON HCL 4 MG/2ML IJ SOLN: 4.0000 mg | Freq: Once | INTRAMUSCULAR | Status: DC

## 2022-10-22 NOTE — ED Triage Notes (Addendum)
PT here via [facility].  States acute onset emesis and L sided chest pain that radiates to back.  Was seen here last night for chest pain.  Does state marijuana use today.

## 2022-10-22 NOTE — ED Provider Notes (Signed)
[place] [facility] AT [facility] Provider Note   CSN: [id number] Arrival date & time: 10/22/22  2113     History {Add pertinent medical, surgical, social history, OB history to HPI:1} Chief Complaint  Patient presents with  . Emesis    Jamie Robinson is a 47 y.o. male.  47 year old male presents to the emergency department via EMS for vomiting.  He is very erratic and fidgety in the exam room complaining of ongoing nausea.  There is emesis sputum over the trash can on the floor.  He states that he had a pack of cookies and a milkshake from cookout.  Subsequently smoked some marijuana; was drinking alcohol earlier today.  Began to feel nauseated with persistent vomiting.  His triage note references left-sided chest pain radiating to his back; however, he denies this with me.  States that his only complaint is of pain in his posterior neck which is exacerbated by his waxing/waning nausea.  He has no complaints of abdominal pain.  Has not had any bowel changes, recent fever.  Denies prior abdominal surgeries.  No medications taken prior to arrival for symptoms.  The history is provided by the patient. No language interpreter was used.  Emesis      Home Medications Prior to Admission medications   Medication Sig Start Date End Date Taking? Authorizing Provider  aluminum sulfate-calcium acetate (DOMEBORO) packet Placed packet contents in a few inches of warm water in the tub for sit bath Patient not taking: Reported on 08/19/2020 01/16/20   Merrilee Jansky, MD  Cholecalciferol 50 MCG (2000 UT) TABS Take 2,000 Units by mouth daily. 05/17/20   [provider]  cyclobenzaprine (FLEXERIL) 10 MG tablet Take 10 mg by mouth 3 (three) times daily as needed for muscle spasms. 05/10/20   [provider]  hydrocortisone (ANUSOL-HC) 2.5 % rectal cream Place 1 application rectally 3 (three) times daily. Patient not taking: Reported on 08/19/2020 01/16/20    Merrilee Jansky, MD  ketorolac (ACULAR) 0.5 % ophthalmic solution Place 1 drop into both eyes every 6 (six) hours. 01/25/21   White, Elita Boone, NP  prazosin (MINIPRESS) 1 MG capsule Take 1 mg by mouth at bedtime. 05/19/20   [provider]  psyllium (METAMUCIL) 58.6 % packet Take 1 packet by mouth daily as needed (constipation). 02/13/20   [provider]  QUEtiapine (SEROQUEL) 25 MG tablet Take 25 mg by mouth at bedtime. 05/19/20   [provider]  sertraline (ZOLOFT) 50 MG tablet Take 25 mg by mouth daily. 05/19/20   [provider]  tobramycin (TOBREX) 0.3 % ophthalmic solution Place 1 drop into the left eye every 6 (six) hours. 05/30/22   Mardella Layman, MD      Allergies    Patient has no known allergies.    Review of Systems   Review of Systems  Unable to perform ROS: Acuity of condition  Gastrointestinal:  Positive for vomiting.    Physical Exam Updated Vital Signs BP (!) 136/94 (BP Location: Right Arm)   Pulse 82   Temp 98.3 F (36.8 C) (Oral)   Resp (!) 24   Ht 5\' 10"  (1.778 m)   Wt 83.5 kg   SpO2 100%   BMI 26.41 kg/m   Physical Exam Vitals and nursing note reviewed.  Constitutional:      General: He is not in acute distress.    Appearance: He is well-developed. He is ill-appearing. He is not toxic-appearing or diaphoretic.  Comments: Lying face down on bed, moaning and swaying back and forth.  HENT:     Head: Normocephalic and atraumatic.  Eyes:     General: No scleral icterus.    Conjunctiva/sclera: Conjunctivae normal.  Cardiovascular:     Rate and Rhythm: Normal rate and regular rhythm.     Pulses: Normal pulses.  Pulmonary:     Effort: Pulmonary effort is normal. No respiratory distress.     Breath sounds: No stridor. No wheezing.     Comments: Respirations even and unlabored Abdominal:     Palpations: Abdomen is soft. There is no mass.     Tenderness: There is no abdominal tenderness. There is no guarding.      Comments: Abdomen soft, nondistended.  No focal abdominal tenderness or guarding.  No peritoneal signs.  Musculoskeletal:        General: Normal range of motion.     Cervical back: Normal range of motion.  Skin:    General: Skin is warm and dry.     Coloration: Skin is not pale.     Findings: No erythema or rash.  Neurological:     Mental Status: He is alert and oriented to person, place, and time.     Coordination: Coordination normal.     Comments: Moving all extremities spontaneously  Psychiatric:        Behavior: Behavior normal.     ED Results / Procedures / Treatments   Labs (all labs ordered are listed, but only abnormal results are displayed) Labs Reviewed  BASIC METABOLIC PANEL - Abnormal; Notable for the following components:      Result Value   Potassium 3.2 (*)    CO2 19 (*)    Glucose, Bld 128 (*)    Creatinine, Ser 1.34 (*)    Anion gap 19 (*)    All other components within normal limits  CBC - Abnormal; Notable for the following components:   MCV 79.9 (*)    MCHC 37.0 (*)    All other components within normal limits  LIPASE, BLOOD  HEPATIC FUNCTION PANEL  RAPID URINE DRUG SCREEN, HOSP PERFORMED  ETHANOL  TROPONIN I (HIGH SENSITIVITY)    EKG None  Radiology DG CHEST PORT 1 VIEW  Result Date: 10/22/2022 CLINICAL DATA:  Chest pain. Acute vomiting and left-sided chest pain radiating to the back. Also seen last night for chest pain. EXAM: PORTABLE CHEST 1 VIEW COMPARISON:  10/21/2022 FINDINGS: The heart size and mediastinal contours are within normal limits. Both lungs are clear. The visualized skeletal structures are unremarkable. IMPRESSION: No active disease. Electronically Signed   By: Burman Nieves M.D.   On: 10/22/2022 22:07   DG Chest 2 View  Result Date: 10/21/2022 CLINICAL DATA:  Sharp chest pain.  Right wrist pain. EXAM: CHEST - 2 VIEW COMPARISON:  11/02/2020 FINDINGS: The heart size and mediastinal contours are within normal limits. Both lungs  are clear. The visualized skeletal structures are unremarkable. IMPRESSION: No active cardiopulmonary disease. Electronically Signed   By: Burman Nieves M.D.   On: 10/21/2022 22:14    Procedures Procedures  {Document cardiac monitor, telemetry assessment procedure when appropriate:1}  Medications Ordered in ED Medications  famotidine (PEPCID) IVPB 20 mg premix (20 mg Intravenous New Bag/Given 10/22/22 2240)  potassium chloride 10 mEq in 100 mL IVPB (10 mEq Intravenous New Bag/Given 10/22/22 2239)  droperidol (INAPSINE) 2.5 MG/ML injection 2.5 mg (2.5 mg Intravenous Given 10/22/22 2238)  sodium chloride 0.9 % bolus 1,000 mL (1,000  mLs Intravenous New Bag/Given 10/22/22 2238)    ED Course/ Medical Decision Making/ A&P Clinical Course as of 10/22/22 2244  Sun Oct 22, 2022  2243 IV potassium ordered for hypokalemia.  He does have a mild AKI compared to baseline which is suspected to be related to dehydration.  Will give IV fluids.  Droperidol and Pepcid ordered for nausea. [KH]  2243 Chest x-ray reviewed and interpreted without evidence of acute cardiopulmonary abnormality.  Specifically, no air under the diaphragm to suggest ruptured viscus.  No mediastinal free air to suggest Boerhaave's. [KH]    Clinical Course User Index [KH] Antony Madura, PA-C   {   Click here for ABCD2, HEART and other calculatorsREFRESH Note before signing :1}                          Medical Decision Making Amount and/or Complexity of Data Reviewed Labs: ordered.  Risk Prescription drug management.   ***  {Document critical care time when appropriate:1} {Document review of labs and clinical decision tools ie heart score, Chads2Vasc2 etc:1}  {Document your independent review of radiology images, and any outside records:1} {Document your discussion with family members, caretakers, and with consultants:1} {Document social determinants of health affecting pt's care:1} {Document your decision making why or why not  admission, treatments were needed:1} Final Clinical Impression(s) / ED Diagnoses Final diagnoses:  None    Rx / DC Orders ED Discharge Orders     None

## 2022-10-23 LAB — TROPONIN I (HIGH SENSITIVITY): Troponin I (High Sensitivity): 8 ng/L (ref <18)

## 2022-10-23 MED ORDER — PROMETHAZINE HCL 25 MG PO TABS: 25.0000 mg | ORAL_TABLET | Freq: Four times a day (QID) | ORAL | 0 refills | Status: DC | PRN

## 2022-10-23 MED ORDER — PROMETHAZINE HCL 25 MG PO TABS
25.0000 mg | ORAL_TABLET | Freq: Once | ORAL | Status: AC
  Administered 2022-10-23: 25 mg via ORAL
  Filled 2022-10-23: qty 1

## 2022-10-23 MED ORDER — PROMETHAZINE HCL 25 MG PO TABS: 25.0000 mg | ORAL_TABLET | Freq: Four times a day (QID) | ORAL | 0 refills | Status: AC | PRN

## 2022-10-23 NOTE — Discharge Instructions (Signed)
Avoid fried foods, fatty foods, greasy foods, and milk products until symptoms resolve. Drink plenty of clear liquids. We recommend the use of Phenergan as prescribed for nausea/vomiting. Follow-up with your primary care doctor to ensure resolution of symptoms. 

## 2022-10-23 NOTE — ED Notes (Signed)
No emesis noted since fluids given

## 2022-10-26 ENCOUNTER — Encounter (HOSPITAL_COMMUNITY): Payer: Self-pay

## 2022-10-26 ENCOUNTER — Ambulatory Visit (HOSPITAL_COMMUNITY)
Admission: EM | Admit: 2022-10-26 | Discharge: 2022-10-26 | Disposition: A | Discharge status: Home / Self Care | Payer: No Typology Code available for payment source | Attending: Emergency Medicine | Admitting: Emergency Medicine

## 2022-10-26 ENCOUNTER — Telehealth (HOSPITAL_COMMUNITY): Payer: Self-pay

## 2022-10-26 DIAGNOSIS — H5789 Other specified disorders of eye and adnexa: Secondary | ICD-10-CM

## 2022-10-26 MED ORDER — OLOPATADINE HCL 0.1 % OP SOLN: 1.0000 [drp] | Freq: Two times a day (BID) | OPHTHALMIC | 1 refills | Status: AC

## 2022-10-26 MED ORDER — TETRACAINE HCL 0.5 % OP SOLN
OPHTHALMIC | Status: AC
  Filled 2022-10-26: qty 4

## 2022-10-26 NOTE — Telephone Encounter (Signed)
Spoke with [employer] PA and was informed that the patient could purchase Olopatadine 0.2% drops over the counter and use as prescribed.   Patient verbalized understanding

## 2022-10-26 NOTE — ED Triage Notes (Signed)
Pt states woke up with rt eye watery and pain. States unknown what it in his eye. Pt states can't see and pain to open both eyes. Pt refused a visual acuity.

## 2022-10-26 NOTE — Telephone Encounter (Signed)
Patient calling in and states the [facility] on [address] did not have the eye drops in stock that were sent in for the patient.   Called and spoke with the pharmacy. They state the medication is over the counter and they do not have the eye drops in stock in the pharmacy or retail side. They state the 0.1% is not available but the 0.2% is available over the counter at other stores.   Will speak with the provider who saw the patient for guidance.

## 2022-10-26 NOTE — ED Provider Notes (Signed)
[facility]    CSN: [id number] Arrival date & time: 10/26/22  0831      History   Chief Complaint Chief Complaint  Patient presents with   Eye Pain    HPI Jamie Robinson is a 47 y.o. male.  Right eye irritation that began this morning Clear watering. Reports he "can't see" because he can't open his eye without discomfort. Unknown if foreign body but maybe feels something in there Does not wear contacts No pain with eye movement, no discharge.  Has not attempted interventions History of this that usually will resolve on its own.  Past Medical History:  Diagnosis Date   Anxiety    Back pain     There are no problems to display for this patient.   History reviewed. No pertinent surgical history.     Home Medications    Prior to Admission medications   Medication Sig Start Date End Date Taking? Authorizing Provider  olopatadine (PATANOL) 0.1 % ophthalmic solution Place 1 drop into the right eye 2 (two) times daily. 10/26/22  Yes Conleigh Heinlein, Ray Church  Cholecalciferol 50 MCG (2000 UT) TABS Take 2,000 Units by mouth daily. 05/17/20   [provider]  cyclobenzaprine (FLEXERIL) 10 MG tablet Take 10 mg by mouth 3 (three) times daily as needed for muscle spasms. 05/10/20   [provider]  prazosin (MINIPRESS) 1 MG capsule Take 1 mg by mouth at bedtime. 05/19/20   [provider]  promethazine (PHENERGAN) 25 MG tablet Take 1 tablet (25 mg total) by mouth every 6 (six) hours as needed for nausea or vomiting. 10/23/22   Antony Madura, PA-C  psyllium (METAMUCIL) 58.6 % packet Take 1 packet by mouth daily as needed (constipation). 02/13/20   [provider]  QUEtiapine (SEROQUEL) 25 MG tablet Take 25 mg by mouth at bedtime. 05/19/20   [provider]  sertraline (ZOLOFT) 50 MG tablet Take 25 mg by mouth daily. 05/19/20   [provider]    Family History History reviewed. No pertinent family history.  Social  History Social History   Tobacco Use   Smoking status: Every Day    Types: Cigarettes   Smokeless tobacco: Never  Vaping Use   Vaping Use: Never used  Substance Use Topics   Alcohol use: Yes    Comment: occ   Drug use: Yes    Types: Marijuana     Allergies   Patient has no known allergies.   Review of Systems Review of Systems As per HPI  Physical Exam Triage Vital Signs ED Triage Vitals  Enc Vitals Group     BP      Pulse      Resp      Temp      Temp src      SpO2      Weight      Height      Head Circumference      Peak Flow      Pain Score      Pain Loc      Pain Edu?      Excl. in GC?    No data found.  Updated Vital Signs BP 125/85 (BP Location: Left Arm)   Pulse 72   Temp 98.6 F (37 C) (Oral)   Resp 18   SpO2 98%    Patient declined visual acuity  Physical Exam Vitals and nursing note reviewed.  Constitutional:      Appearance: Normal appearance.  HENT:     Head: Normocephalic and atraumatic.     Mouth/Throat:     Pharynx: Oropharynx is clear.  Eyes:     General: Lids are normal. Lids are everted, no foreign bodies appreciated. Vision grossly intact. Gaze aligned appropriately.        Right eye: No discharge.        Left eye: No discharge.     Extraocular Movements: Extraocular movements intact.     Conjunctiva/sclera: Conjunctivae normal.     Right eye: Right conjunctiva is not injected.     Left eye: Left conjunctiva is not injected.     Pupils: Pupils are equal, round, and reactive to light.     Right eye: No corneal abrasion or fluorescein uptake.     Comments: No fluorescein uptake. No foreign body noted.   Cardiovascular:     Rate and Rhythm: Normal rate and regular rhythm.     Heart sounds: Normal heart sounds.  Pulmonary:     Effort: Pulmonary effort is normal.     Breath sounds: Normal breath sounds.  Musculoskeletal:     Cervical back: Normal range of motion.  Lymphadenopathy:     Cervical: No cervical adenopathy.   Skin:    General: Skin is warm and dry.  Neurological:     Mental Status: He is alert and oriented to person, place, and time.     UC Treatments / Results  Labs (all labs ordered are listed, but only abnormal results are displayed) Labs Reviewed - No data to display  EKG  Radiology No results found.  Procedures Procedures  Medications Ordered in UC Medications - No data to display  Initial Impression / Assessment and Plan / UC Course  I have reviewed the triage vital signs and the nursing notes.  Pertinent labs & imaging results that were available during my care of the patient were reviewed by me and considered in my medical decision making (see chart for details).  Improvement with the tetracaine drops.  There was no uptake seen on Woods lamp exam.  No foreign body noted.  Unknown why he was having the irritation that he is feeling better now.  No concern for bacterial infection.  Continue saline rinse at home and can try olopatadine drops twice daily for comfort. Odd that he has this happen every now and then without unknown reason.  I have advised him to follow-up with an eye specialist for further evaluation.  Return precautions discussed  Final Clinical Impressions(s) / UC Diagnoses   Final diagnoses:  Irritation of right eye     Discharge Instructions      Use the saline rinse as often as needed  You can use the eye drops twice daily (morning and night) which may be soothing  Please follow up with an eye specialist      ED Prescriptions     Medication Sig Dispense Auth. Provider   olopatadine (PATANOL) 0.1 % ophthalmic solution Place 1 drop into the right eye 2 (two) times daily. 5 mL Billi Bright, Lurena Joiner, PA-C      PDMP not reviewed this encounter.   Marlow Baars, [place] 10/26/22 1345

## 2022-10-26 NOTE — Discharge Instructions (Addendum)
Use the saline rinse as often as needed  You can use the eye drops twice daily (morning and night) which may be soothing  Please follow up with an eye specialist

## 2022-12-05 IMAGING — CR DG CHEST 2V
2 series · 2 of 2 positions shown · non-contrast
Comparison: Radiograph 08/19/2020

CLINICAL DATA: Right-sided back pain, cough, fall last night
landing on right side

EXAM:
CHEST - 2 VIEW

[chest pa]
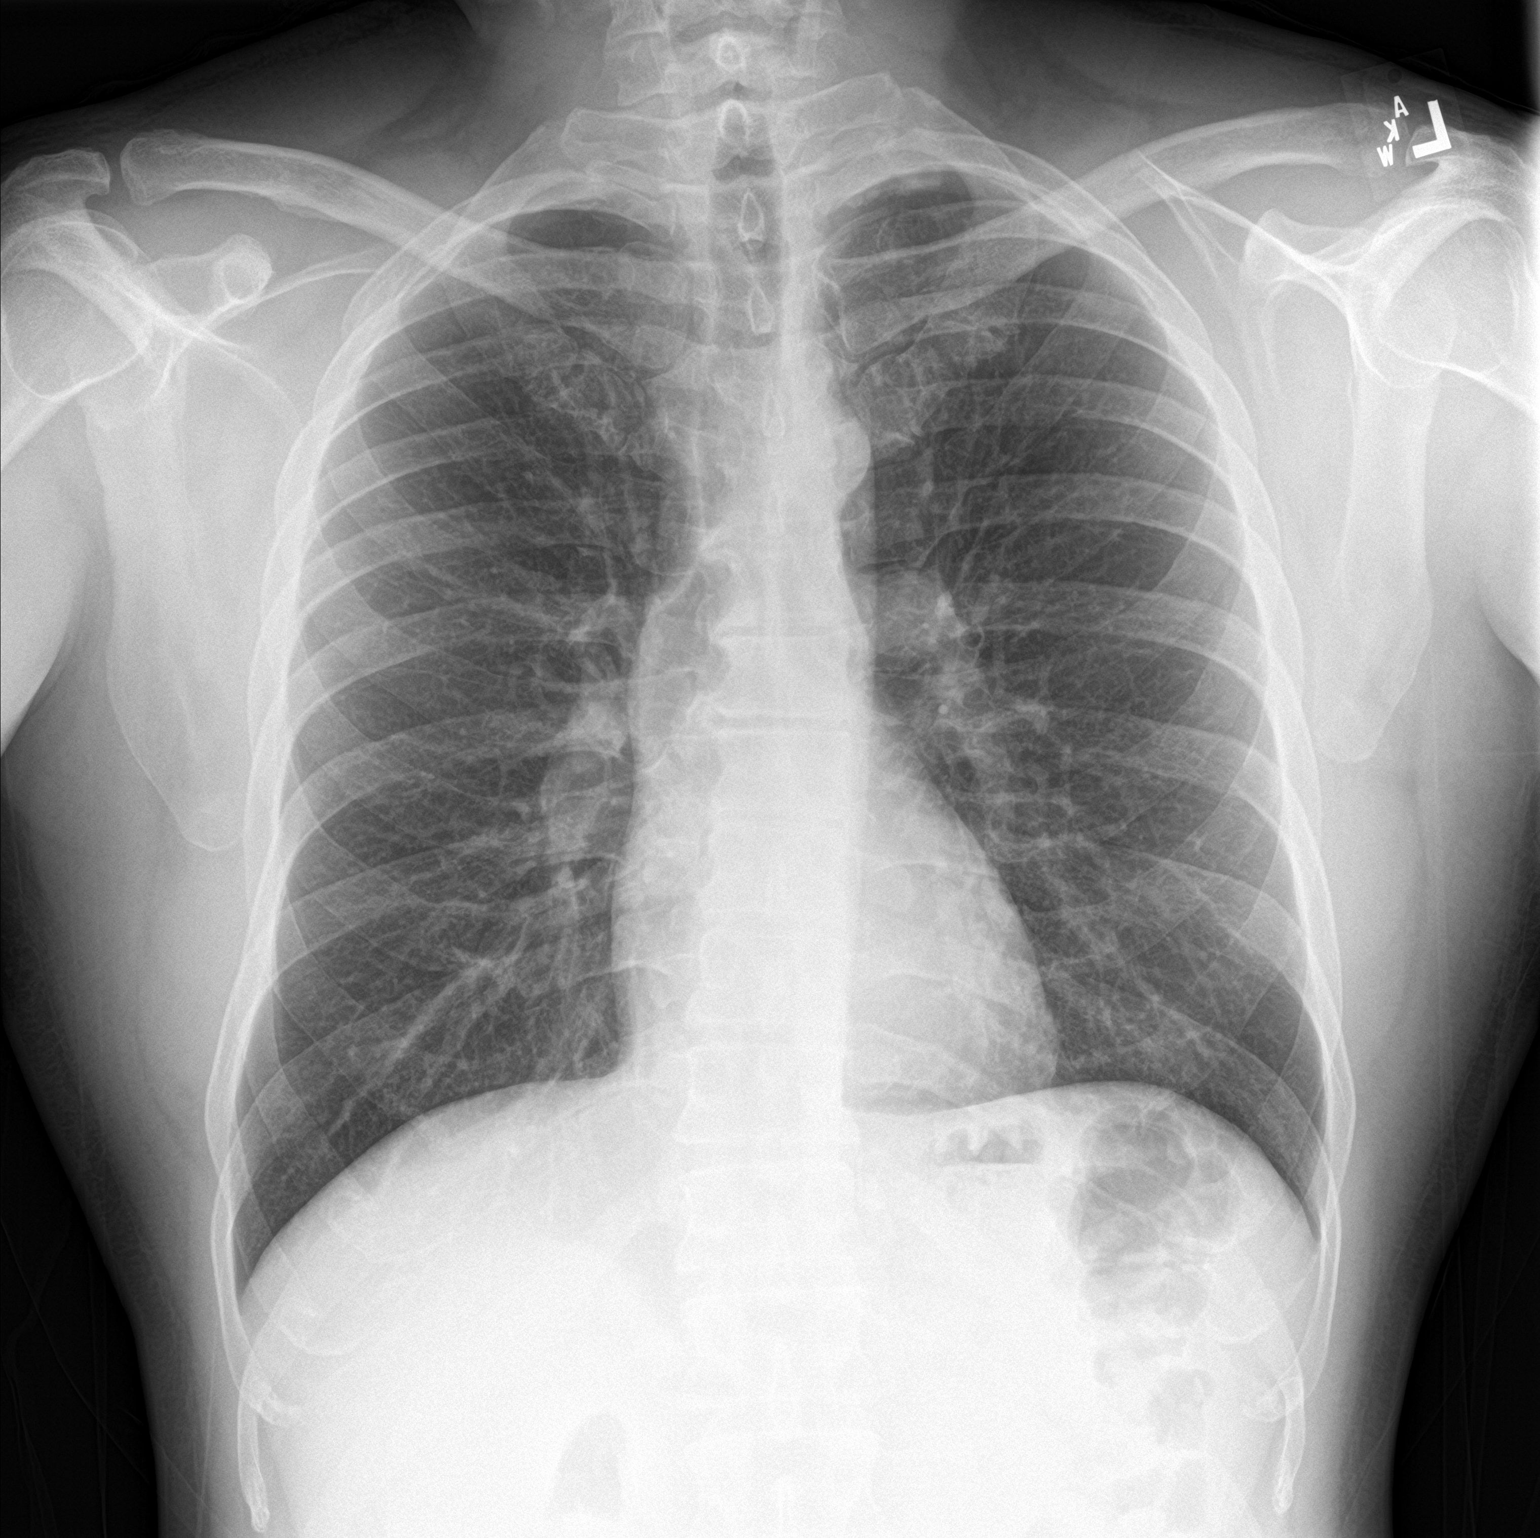

[chest lat]
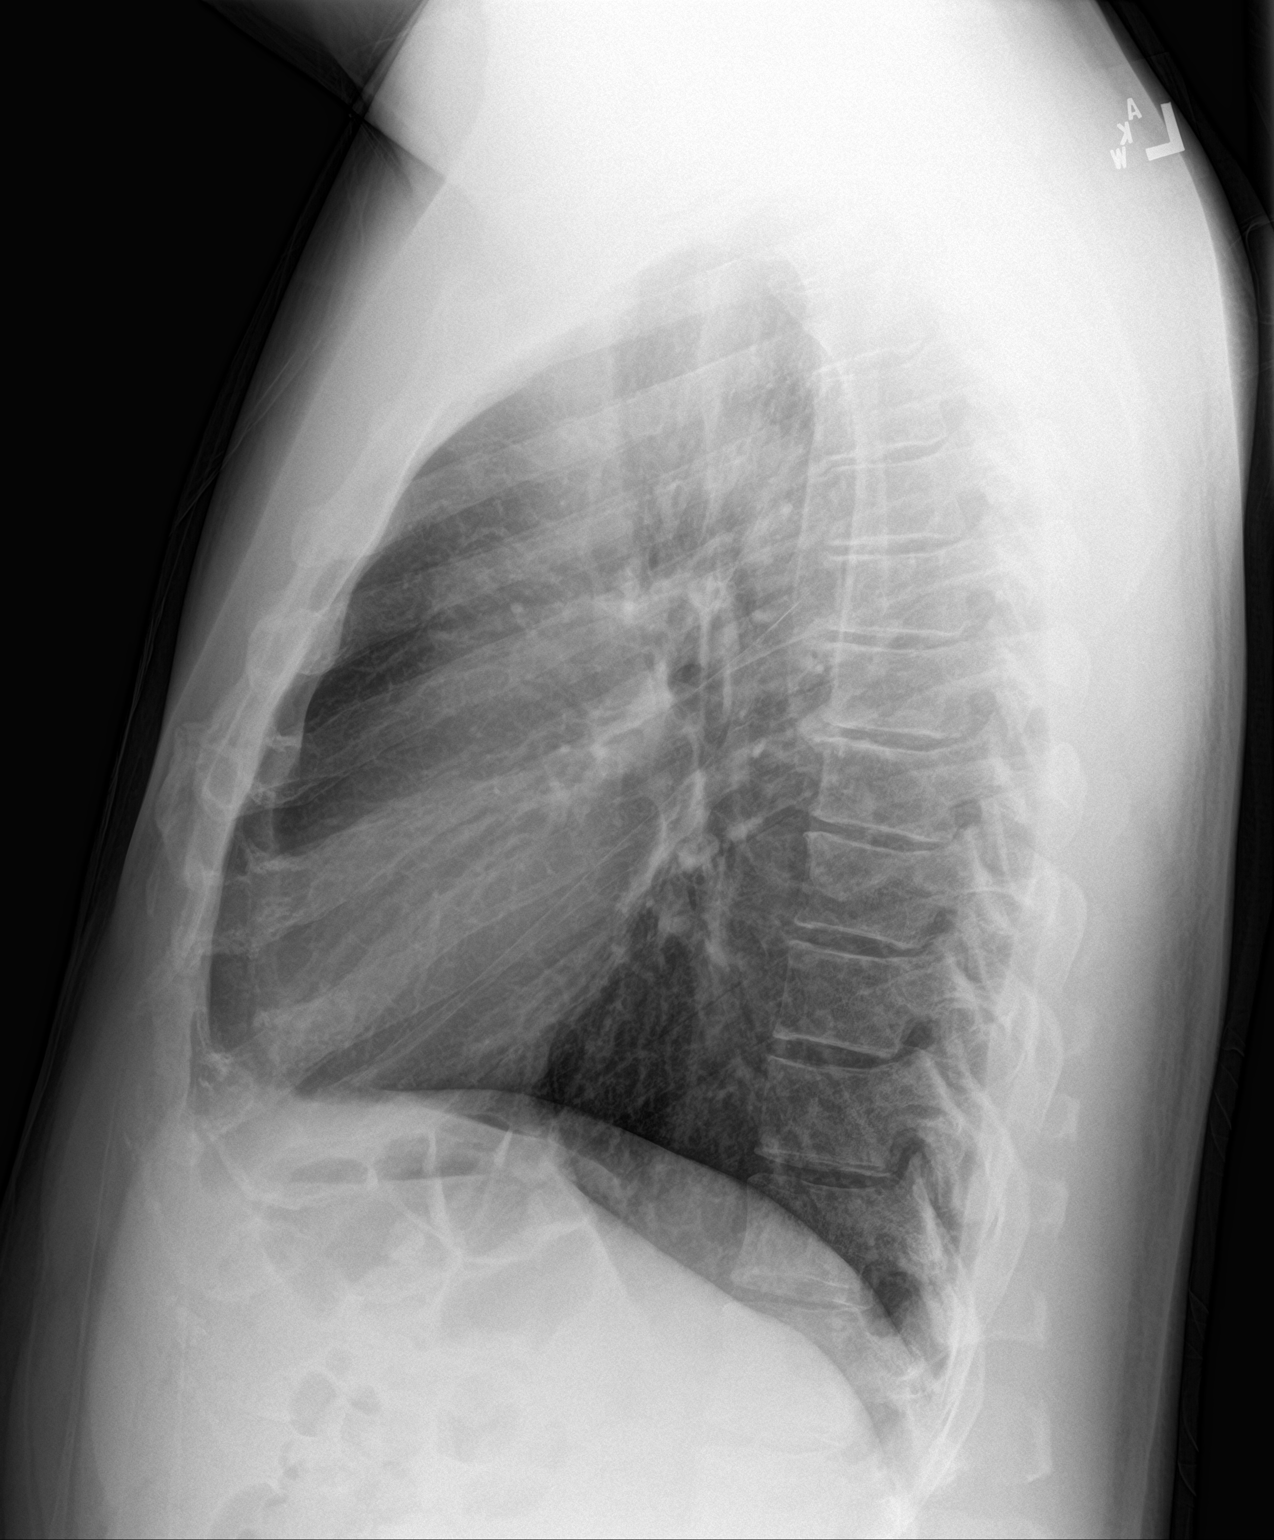

[2 of 2 positions shown; findings below may reference images not displayed]

FINDINGS: No visible displaced rib fractures or other acute traumatic findings
of the chest within the limitations of the two view radiograph.
Degenerative changes are present in the imaged spine and shoulders.

No consolidation, features of edema, pneumothorax, or effusion.
Pulmonary vascularity is normally distributed. The cardiomediastinal
contours are unremarkable.
IMPRESSION: No acute cardiopulmonary or traumatic findings of the chest.

## 2022-12-11 ENCOUNTER — Ambulatory Visit (HOSPITAL_COMMUNITY)
Admission: EM | Admit: 2022-12-11 | Discharge: 2022-12-11 | Disposition: A | Discharge status: Home / Self Care | Payer: No Typology Code available for payment source | Attending: Family Medicine | Admitting: Family Medicine

## 2022-12-11 ENCOUNTER — Encounter (HOSPITAL_COMMUNITY): Payer: Self-pay

## 2022-12-11 DIAGNOSIS — H00014 Hordeolum externum left upper eyelid: Secondary | ICD-10-CM

## 2022-12-11 MED ORDER — CEPHALEXIN 500 MG PO CAPS: 500.0000 mg | ORAL_CAPSULE | Freq: Three times a day (TID) | ORAL | 0 refills | Status: AC

## 2022-12-11 NOTE — ED Provider Notes (Signed)
[facility]    CSN: [id number] Arrival date & time: 12/11/22  8413      History   Chief Complaint Chief Complaint  Patient presents with   Eye Problem    HPI Jamie Robinson is a 47 y.o. male.    Eye Problem Here for swelling on left upper eyelid.  He states has been going on for about 6 weeks.  He was seen here in early June, but that that time the history was more about eye irritation.  The physical exam did not document any eye swelling at that time.  He has not had any drainage from the eye and no eye redness now.  He thinks maybe the bump is drained occasionally   No allergies to medications  He states he has been unable to get to the [place] as he does not have a car.  [facility]  Past Medical History:  Diagnosis Date   Anxiety    Back pain     There are no problems to display for this patient.   History reviewed. No pertinent surgical history.     Home Medications    Prior to Admission medications   Medication Sig Start Date End Date Taking? Authorizing Provider  cephALEXin (KEFLEX) 500 MG capsule Take 1 capsule (500 mg total) by mouth 3 (three) times daily for 7 days. 12/11/22 12/18/22 Yes Zenia Resides, MD  Cholecalciferol 50 MCG (2000 UT) TABS Take 2,000 Units by mouth daily. 05/17/20   [provider]  cyclobenzaprine (FLEXERIL) 10 MG tablet Take 10 mg by mouth 3 (three) times daily as needed for muscle spasms. 05/10/20   [provider]  olopatadine (PATANOL) 0.1 % ophthalmic solution Place 1 drop into the right eye 2 (two) times daily. 10/26/22   Rising, Lurena Joiner, PA-C  prazosin (MINIPRESS) 1 MG capsule Take 1 mg by mouth at bedtime. 05/19/20   [provider]  promethazine (PHENERGAN) 25 MG tablet Take 1 tablet (25 mg total) by mouth every 6 (six) hours as needed for nausea or vomiting. 10/23/22   Antony Madura, PA-C  psyllium (METAMUCIL) 58.6 % packet Take 1 packet by mouth  daily as needed (constipation). 02/13/20   [provider]  QUEtiapine (SEROQUEL) 25 MG tablet Take 25 mg by mouth at bedtime. 05/19/20   [provider]  sertraline (ZOLOFT) 50 MG tablet Take 25 mg by mouth daily. 05/19/20   [provider]    Family History History reviewed. No pertinent family history.  Social History Social History   Tobacco Use   Smoking status: Every Day    Types: Cigarettes   Smokeless tobacco: Never  Vaping Use   Vaping status: Never Used  Substance Use Topics   Alcohol use: Yes    Comment: occ   Drug use: Yes    Types: Marijuana     Allergies   Patient has no known allergies.   Review of Systems Review of Systems   Physical Exam Triage Vital Signs ED Triage Vitals [12/11/22 0843]  Encounter Vitals Group     BP 118/80     Systolic BP Percentile      Diastolic BP Percentile      Pulse Rate 66     Resp 16     Temp 98.1 F (36.7 C)     Temp Source Oral     SpO2 96 %     Weight 169 lb (76.7 kg)  Height 5\' 10"  (1.778 m)     Head Circumference      Peak Flow      Pain Score 0     Pain Loc      Pain Education      Exclude from Growth Chart    No data found.  Updated Vital Signs BP 118/80 (BP Location: Right Arm)   Pulse 66   Temp 98.1 F (36.7 C) (Oral)   Resp 16   Ht 5\' 10"  (1.778 m)   Wt 76.7 kg   SpO2 96%   BMI 24.25 kg/m   Visual Acuity Right Eye Distance:   Left Eye Distance:   Bilateral Distance:    Right Eye Near:   Left Eye Near:    Bilateral Near:     Physical Exam Vitals reviewed.  Constitutional:      General: He is not in acute distress.    Appearance: He is not ill-appearing, toxic-appearing or diaphoretic.  Eyes:     Extraocular Movements: Extraocular movements intact.     Conjunctiva/sclera: Conjunctivae normal.     Pupils: Pupils are equal, round, and reactive to light.     Comments: There is some swelling about 4 to 5 mm in diameter on the right upper eyelid medial  part.  It is fairly firm and not fluctuant.  Skin:    Coloration: Skin is not pale.  Neurological:     General: No focal deficit present.     Mental Status: He is alert and oriented to person, place, and time.  Psychiatric:        Behavior: Behavior normal.      UC Treatments / Results  Labs (all labs ordered are listed, but only abnormal results are displayed) Labs Reviewed - No data to display  EKG   Radiology No results found.  Procedures Procedures (including critical care time)  Medications Ordered in UC Medications - No data to display  Initial Impression / Assessment and Plan / UC Course  I have reviewed the triage vital signs and the nursing notes.  Pertinent labs & imaging results that were available during my care of the patient were reviewed by me and considered in my medical decision making (see chart for details).     I am going to go ahead and try some oral antibiotics with some Keflex for him.  I discussed with him that often oral antibiotics are not indicated for a hordeolum or stye, but since this 1 has been there so long we will try that.  I have asked him to please follow-up with the [place] as he is planning.  If it worsens a lot I have asked him to go to the emergency room.  We do not have slit-lamp exam capability here in the urgent care  Antibiotic is sent to the [facility].  He states he will be able to send that to him.   Final Clinical Impressions(s) / UC Diagnoses   Final diagnoses:  Hordeolum externum of left upper eyelid     Discharge Instructions      Take cephalexin 500 mg--1 capsule 3 times daily for 7 days      ED Prescriptions     Medication Sig Dispense Auth. Provider   cephALEXin (KEFLEX) 500 MG capsule Take 1 capsule (500 mg total) by mouth 3 (three) times daily for 7 days. 21 capsule Zenia Resides, MD      PDMP not reviewed this encounter.   Zenia Resides, MD  12/11/22 0924  

## 2022-12-11 NOTE — ED Triage Notes (Signed)
Patient here today with c/o swelling on his left eyelid X 6 weeks. He came here once before for the same issue but not getting any better. He contacted the [place] for an appointment but could not get in to see an eye doctor until October.

## 2022-12-11 NOTE — Discharge Instructions (Signed)
Take cephalexin 500 mg--1 capsule 3 times daily for 7 days

## 2023-04-19 ENCOUNTER — Other Ambulatory Visit: Payer: Self-pay

## 2023-04-19 ENCOUNTER — Emergency Department (HOSPITAL_COMMUNITY)
Admission: EM | Admit: 2023-04-19 | Discharge: 2023-04-19 | Disposition: A | Discharge status: Home / Self Care | Payer: No Typology Code available for payment source | Attending: Emergency Medicine | Admitting: Emergency Medicine

## 2023-04-19 DIAGNOSIS — R110 Nausea: Secondary | ICD-10-CM | POA: Diagnosis present

## 2023-04-19 DIAGNOSIS — F1290 Cannabis use, unspecified, uncomplicated: Secondary | ICD-10-CM | POA: Insufficient documentation

## 2023-04-19 LAB — CBC WITH DIFFERENTIAL/PLATELET
Abs Immature Granulocytes: 0 10*3/uL (ref 0.00–0.07)
Basophils Absolute: 0 10*3/uL (ref 0.0–0.1)
Basophils Relative: 1 %
Eosinophils Absolute: 0 10*3/uL (ref 0.0–0.5)
Eosinophils Relative: 1 %
HCT: 42.4 % (ref 39.0–52.0)
Hemoglobin: 15.5 g/dL (ref 13.0–17.0)
Immature Granulocytes: 0 %
Lymphocytes Relative: 34 %
Lymphs Abs: 1.3 10*3/uL (ref 0.7–4.0)
MCH: 29 pg (ref 26.0–34.0)
MCHC: 36.6 g/dL — ABNORMAL HIGH (ref 30.0–36.0)
MCV: 79.4 fL — ABNORMAL LOW (ref 80.0–100.0)
Monocytes Absolute: 0.2 10*3/uL (ref 0.1–1.0)
Monocytes Relative: 6 %
Neutro Abs: 2.2 10*3/uL (ref 1.7–7.7)
Neutrophils Relative %: 58 %
Platelets: 170 10*3/uL (ref 150–400)
RBC: 5.34 MIL/uL (ref 4.22–5.81)
RDW: 12.2 % (ref 11.5–15.5)
WBC: 3.8 10*3/uL — ABNORMAL LOW (ref 4.0–10.5)
nRBC: 0 % (ref 0.0–0.2)

## 2023-04-19 LAB — TROPONIN I (HIGH SENSITIVITY): Troponin I (High Sensitivity): 2 ng/L (ref <18)

## 2023-04-19 LAB — COMPREHENSIVE METABOLIC PANEL
ALT: 15 U/L (ref 0–44)
AST: 21 U/L (ref 15–41)
Albumin: 4.2 g/dL (ref 3.5–5.0)
Alkaline Phosphatase: 51 U/L (ref 38–126)
Anion gap: 8 (ref 5–15)
BUN: 8 mg/dL (ref 6–20)
CO2: 27 mmol/L (ref 22–32)
Calcium: 9.4 mg/dL (ref 8.9–10.3)
Chloride: 100 mmol/L (ref 98–111)
Creatinine, Ser: 1.11 mg/dL (ref 0.61–1.24)
GFR, Estimated: >60 mL/min (ref >60)
Glucose, Bld: 100 mg/dL — ABNORMAL HIGH (ref 70–99)
Potassium: 3.5 mmol/L (ref 3.5–5.1)
Sodium: 135 mmol/L (ref 135–145)
Total Bilirubin: 0.7 mg/dL (ref <1.2)
Total Protein: 6.9 g/dL (ref 6.5–8.1)

## 2023-04-19 LAB — LIPASE, BLOOD: Lipase: 65 U/L — ABNORMAL HIGH (ref 11–51)

## 2023-04-19 MED ORDER — ONDANSETRON 4 MG PO TBDP
4.0000 mg | ORAL_TABLET | Freq: Once | ORAL | Status: AC
  Administered 2023-04-19: 4 mg via ORAL
  Filled 2023-04-19: qty 1

## 2023-04-19 MED ORDER — HYDROXYZINE HCL 25 MG PO TABS
25.0000 mg | ORAL_TABLET | Freq: Once | ORAL | Status: AC
  Administered 2023-04-19: 25 mg via ORAL
  Filled 2023-04-19: qty 1

## 2023-04-19 MED ORDER — ONDANSETRON 4 MG PO TBDP: 4.0000 mg | ORAL_TABLET | Freq: Three times a day (TID) | ORAL | 0 refills | Status: AC | PRN

## 2023-04-19 MED ORDER — HYDROXYZINE HCL 25 MG PO TABS: 25.0000 mg | ORAL_TABLET | Freq: Three times a day (TID) | ORAL | 0 refills | Status: AC | PRN

## 2023-04-19 NOTE — ED Triage Notes (Signed)
Patient smoked marijuana this morning reports feeling " tired " /fatigue with nausea .

## 2023-04-19 NOTE — Discharge Instructions (Signed)
Stop using marijuana.  Take the nausea medication as prescribed.  Follow-up with your doctor.  Return to the ED with new or worsening symptoms.

## 2023-04-19 NOTE — ED Provider Notes (Signed)
[place] EM[facility]ENC[facility]PART[facility] AT [facility] Provider Note   CSN: [id number] Arrival date & time: 04/19/23  0501     History  Chief Complaint  Patient presents with   Smoked Sheran Fava / Nausea    Jamie Robinson is a 47 y.o. male.  Patient with a history of anxiety here with feeling anxious and nauseous and feeling "off" since smoking weed this morning for the first time in 10 days.  States he is not been sleeping well and woke up to smokes and we did try to go back to sleep.  He is nervous that he could start having nausea and vomiting again as this has happened to him previously though he is not having any nausea or vomiting currently.  He feels lightheaded, anxious and "does not want to be high".  Denies any chest pain, shortness of breath, headache, visual changes, focal weakness, numbness or tingling.  No visual changes or room spinning dizziness.  No difficulty speaking or difficulty swallowing.  No chest pain.  No shortness of breath.  No abdominal pain, nausea or vomiting.  Denies SI or HI  The history is provided by the patient.       Home Medications Prior to Admission medications   Medication Sig Start Date End Date Taking? Authorizing Provider  Cholecalciferol 50 MCG (2000 UT) TABS Take 2,000 Units by mouth daily. 05/17/20   [provider]  cyclobenzaprine (FLEXERIL) 10 MG tablet Take 10 mg by mouth 3 (three) times daily as needed for muscle spasms. 05/10/20   [provider]  olopatadine (PATANOL) 0.1 % ophthalmic solution Place 1 drop into the right eye 2 (two) times daily. 10/26/22   Rising, Lurena Joiner, PA-C  prazosin (MINIPRESS) 1 MG capsule Take 1 mg by mouth at bedtime. 05/19/20   [provider]  promethazine (PHENERGAN) 25 MG tablet Take 1 tablet (25 mg total) by mouth every 6 (six) hours as needed for nausea or vomiting. 10/23/22   Antony Madura, PA-C  psyllium (METAMUCIL) 58.6 % packet Take 1 packet by mouth daily as needed  (constipation). 02/13/20   [provider]  QUEtiapine (SEROQUEL) 25 MG tablet Take 25 mg by mouth at bedtime. 05/19/20   [provider]  sertraline (ZOLOFT) 50 MG tablet Take 25 mg by mouth daily. 05/19/20   [provider]      Allergies    Patient has no known allergies.    Review of Systems   Review of Systems  Constitutional:  Positive for fatigue. Negative for activity change, appetite change and fever.  HENT:  Negative for congestion.   Respiratory:  Negative for chest tightness, shortness of breath and stridor.   Cardiovascular:  Negative for chest pain.  Gastrointestinal:  Negative for abdominal pain, nausea and vomiting.  Genitourinary:  Negative for dysuria and frequency.  Musculoskeletal:  Positive for arthralgias and myalgias.  Skin:  Negative for rash.  Neurological:  Positive for weakness and light-headedness. Negative for dizziness and numbness.   all other systems are negative except as noted in the HPI and PMH.    Physical Exam Updated Vital Signs BP (!) 140/96 (BP Location: Right Arm)   Pulse 88   Temp 97.8 F (36.6 C) (Oral)   Resp 18   SpO2 100%  Physical Exam Vitals and nursing note reviewed.  Constitutional:      General: He is not in acute distress.    Appearance: He is well-developed.  HENT:     Head: Normocephalic  and atraumatic.     Mouth/Throat:     Pharynx: No oropharyngeal exudate.  Eyes:     Conjunctiva/sclera: Conjunctivae normal.     Pupils: Pupils are equal, round, and reactive to light.  Neck:     Comments: No meningismus. Cardiovascular:     Rate and Rhythm: Normal rate and regular rhythm.     Heart sounds: Normal heart sounds. No murmur heard. Pulmonary:     Effort: Pulmonary effort is normal. No respiratory distress.     Breath sounds: Normal breath sounds.  Abdominal:     Palpations: Abdomen is soft.     Tenderness: There is no abdominal tenderness. There is no guarding or rebound.  Musculoskeletal:         General: No tenderness. Normal range of motion.     Cervical back: Normal range of motion and neck supple.  Skin:    General: Skin is warm.  Neurological:     Mental Status: He is alert and oriented to person, place, and time.     Cranial Nerves: No cranial nerve deficit.     Motor: No abnormal muscle tone.     Coordination: Coordination normal.     Comments:  5/5 strength throughout. CN 2-12 intact.Equal grip strength.   Psychiatric:        Behavior: Behavior normal.     ED Results / Procedures / Treatments   Labs (all labs ordered are listed, but only abnormal results are displayed) Labs Reviewed  CBC WITH DIFFERENTIAL/PLATELET - Abnormal; Notable for the following components:      Result Value   WBC 3.8 (*)    MCV 79.4 (*)    MCHC 36.6 (*)    All other components within normal limits  COMPREHENSIVE METABOLIC PANEL - Abnormal; Notable for the following components:   Glucose, Bld 100 (*)    All other components within normal limits  LIPASE, BLOOD - Abnormal; Notable for the following components:   Lipase 65 (*)    All other components within normal limits  TROPONIN I (HIGH SENSITIVITY)  TROPONIN I (HIGH SENSITIVITY)    EKG EKG Interpretation Date/Time:  Thursday April 19 2023 06:28:43 EST Ventricular Rate:  70 PR Interval:  181 QRS Duration:  76 QT Interval:  375 QTC Calculation: 405 R Axis:   106  Text Interpretation: Right and left arm electrode reversal, interpretation assumes no reversal Sinus rhythm Probable lateral infarct, age indeterminate No significant change was found Confirmed by Glynn Octave [phone]) on 04/19/2023 6:32:39 AM  Radiology No results found.  Procedures Procedures    Medications Ordered in ED Medications  hydrOXYzine (ATARAX) tablet 25 mg (25 mg Oral Given 04/19/23 0609)  ondansetron (ZOFRAN-ODT) disintegrating tablet 4 mg (4 mg Oral Given 04/19/23 0609)    ED Course/ Medical Decision Making/ A&P                                  Medical Decision Making Amount and/or Complexity of Data Reviewed Labs: ordered. Decision-making details documented in ED Course. Radiology: ordered and independent interpretation performed. Decision-making details documented in ED Course. ECG/medicine tests: ordered and independent interpretation performed. Decision-making details documented in ED Course.  Risk Prescription drug management.   Feeling anxious and nauseous and concerned after smoking weed that he could vomit.  Stable vital signs, no distress, abdomen soft without peritoneal signs.  Neurologic exam is nonfocal.  Vitals stable. No increased work of breathing.  Clear lungs.   EKG sinus rhythm. No acute ST changes.  Labs reassuring. Low suspicion for ACS or PE.  Feels improved after zofran and hydroxyzine.   Recommend THC cessation.  No SI or HI.   Will give short course of antiemetics.  Followup with PCP. Return precautions discussed.         Final Clinical Impression(s) / ED Diagnoses Final diagnoses:  Marijuana use    Rx / DC Orders ED Discharge Orders     None         Itzel Mckibbin, Jeannett Senior, MD 04/19/23 [phone]
